# Patient Record
Sex: Male | Born: 1950 | Hispanic: Yes | Marital: Married | Smoking: Former smoker
Health system: Southern US, Community
[De-identification: ages and names within clinical notes are randomized; demographics above are authoritative.]

## PROBLEM LIST (undated history)

## (undated) DIAGNOSIS — E119 Type 2 diabetes mellitus without complications: Secondary | ICD-10-CM

## (undated) DIAGNOSIS — I609 Nontraumatic subarachnoid hemorrhage, unspecified: Secondary | ICD-10-CM

## (undated) DIAGNOSIS — R1310 Dysphagia, unspecified: Secondary | ICD-10-CM

## (undated) DIAGNOSIS — I10 Essential (primary) hypertension: Secondary | ICD-10-CM

## (undated) DIAGNOSIS — R569 Unspecified convulsions: Secondary | ICD-10-CM

## (undated) HISTORY — PX: NO PAST SURGERIES: SHX2092

---

## 2013-06-14 DIAGNOSIS — I609 Nontraumatic subarachnoid hemorrhage, unspecified: Secondary | ICD-10-CM

## 2013-06-14 DIAGNOSIS — R1310 Dysphagia, unspecified: Secondary | ICD-10-CM

## 2013-06-14 HISTORY — DX: Dysphagia, unspecified: R13.10

## 2013-06-14 HISTORY — DX: Nontraumatic subarachnoid hemorrhage, unspecified: I60.9

## 2015-02-22 ENCOUNTER — Emergency Department (HOSPITAL_COMMUNITY): Payer: 59

## 2015-02-22 ENCOUNTER — Inpatient Hospital Stay (HOSPITAL_COMMUNITY)
Admission: EM | Admit: 2015-02-22 | Discharge: 2015-03-17 | DRG: 064 | Disposition: E | Payer: 59 | Attending: Neurology | Admitting: Neurology

## 2015-02-22 ENCOUNTER — Encounter (HOSPITAL_COMMUNITY): Admission: EM | Disposition: E | Payer: Self-pay | Source: Home / Self Care | Attending: Neurology

## 2015-02-22 ENCOUNTER — Inpatient Hospital Stay (HOSPITAL_COMMUNITY): Payer: 59

## 2015-02-22 ENCOUNTER — Encounter (HOSPITAL_COMMUNITY): Payer: Self-pay | Admitting: Anesthesiology

## 2015-02-22 ENCOUNTER — Encounter (HOSPITAL_COMMUNITY): Payer: Self-pay | Admitting: Neurology

## 2015-02-22 DIAGNOSIS — E876 Hypokalemia: Secondary | ICD-10-CM | POA: Diagnosis not present

## 2015-02-22 DIAGNOSIS — T17908A Unspecified foreign body in respiratory tract, part unspecified causing other injury, initial encounter: Secondary | ICD-10-CM

## 2015-02-22 DIAGNOSIS — J9601 Acute respiratory failure with hypoxia: Secondary | ICD-10-CM | POA: Diagnosis not present

## 2015-02-22 DIAGNOSIS — N179 Acute kidney failure, unspecified: Secondary | ICD-10-CM | POA: Diagnosis not present

## 2015-02-22 DIAGNOSIS — R509 Fever, unspecified: Secondary | ICD-10-CM

## 2015-02-22 DIAGNOSIS — Z87891 Personal history of nicotine dependence: Secondary | ICD-10-CM

## 2015-02-22 DIAGNOSIS — Z4659 Encounter for fitting and adjustment of other gastrointestinal appliance and device: Secondary | ICD-10-CM

## 2015-02-22 DIAGNOSIS — D638 Anemia in other chronic diseases classified elsewhere: Secondary | ICD-10-CM | POA: Diagnosis present

## 2015-02-22 DIAGNOSIS — Z66 Do not resuscitate: Secondary | ICD-10-CM | POA: Diagnosis present

## 2015-02-22 DIAGNOSIS — E1165 Type 2 diabetes mellitus with hyperglycemia: Secondary | ICD-10-CM | POA: Diagnosis present

## 2015-02-22 DIAGNOSIS — I2510 Atherosclerotic heart disease of native coronary artery without angina pectoris: Secondary | ICD-10-CM | POA: Diagnosis present

## 2015-02-22 DIAGNOSIS — I619 Nontraumatic intracerebral hemorrhage, unspecified: Secondary | ICD-10-CM

## 2015-02-22 DIAGNOSIS — Z87898 Personal history of other specified conditions: Secondary | ICD-10-CM | POA: Diagnosis not present

## 2015-02-22 DIAGNOSIS — I610 Nontraumatic intracerebral hemorrhage in hemisphere, subcortical: Secondary | ICD-10-CM | POA: Diagnosis not present

## 2015-02-22 DIAGNOSIS — G8191 Hemiplegia, unspecified affecting right dominant side: Secondary | ICD-10-CM | POA: Diagnosis present

## 2015-02-22 DIAGNOSIS — J690 Pneumonitis due to inhalation of food and vomit: Secondary | ICD-10-CM | POA: Diagnosis not present

## 2015-02-22 DIAGNOSIS — R0600 Dyspnea, unspecified: Secondary | ICD-10-CM

## 2015-02-22 DIAGNOSIS — G40909 Epilepsy, unspecified, not intractable, without status epilepticus: Secondary | ICD-10-CM | POA: Diagnosis present

## 2015-02-22 DIAGNOSIS — R1310 Dysphagia, unspecified: Secondary | ICD-10-CM | POA: Insufficient documentation

## 2015-02-22 DIAGNOSIS — J9600 Acute respiratory failure, unspecified whether with hypoxia or hypercapnia: Secondary | ICD-10-CM

## 2015-02-22 DIAGNOSIS — R4701 Aphasia: Secondary | ICD-10-CM | POA: Diagnosis present

## 2015-02-22 DIAGNOSIS — E870 Hyperosmolality and hypernatremia: Secondary | ICD-10-CM | POA: Diagnosis not present

## 2015-02-22 DIAGNOSIS — I609 Nontraumatic subarachnoid hemorrhage, unspecified: Secondary | ICD-10-CM | POA: Diagnosis present

## 2015-02-22 DIAGNOSIS — I615 Nontraumatic intracerebral hemorrhage, intraventricular: Principal | ICD-10-CM | POA: Diagnosis present

## 2015-02-22 DIAGNOSIS — A419 Sepsis, unspecified organism: Secondary | ICD-10-CM | POA: Diagnosis not present

## 2015-02-22 DIAGNOSIS — R000 Tachycardia, unspecified: Secondary | ICD-10-CM | POA: Diagnosis not present

## 2015-02-22 DIAGNOSIS — R2990 Unspecified symptoms and signs involving the nervous system: Secondary | ICD-10-CM

## 2015-02-22 DIAGNOSIS — E440 Moderate protein-calorie malnutrition: Secondary | ICD-10-CM | POA: Diagnosis present

## 2015-02-22 DIAGNOSIS — G936 Cerebral edema: Secondary | ICD-10-CM | POA: Diagnosis present

## 2015-02-22 DIAGNOSIS — E872 Acidosis: Secondary | ICD-10-CM | POA: Diagnosis not present

## 2015-02-22 DIAGNOSIS — I639 Cerebral infarction, unspecified: Secondary | ICD-10-CM

## 2015-02-22 DIAGNOSIS — T17998D Other foreign object in respiratory tract, part unspecified causing other injury, subsequent encounter: Secondary | ICD-10-CM | POA: Diagnosis not present

## 2015-02-22 DIAGNOSIS — I611 Nontraumatic intracerebral hemorrhage in hemisphere, cortical: Secondary | ICD-10-CM | POA: Diagnosis not present

## 2015-02-22 DIAGNOSIS — Z8673 Personal history of transient ischemic attack (TIA), and cerebral infarction without residual deficits: Secondary | ICD-10-CM

## 2015-02-22 DIAGNOSIS — E785 Hyperlipidemia, unspecified: Secondary | ICD-10-CM | POA: Diagnosis present

## 2015-02-22 DIAGNOSIS — R4182 Altered mental status, unspecified: Secondary | ICD-10-CM

## 2015-02-22 DIAGNOSIS — I10 Essential (primary) hypertension: Secondary | ICD-10-CM | POA: Diagnosis present

## 2015-02-22 DIAGNOSIS — I6789 Other cerebrovascular disease: Secondary | ICD-10-CM | POA: Diagnosis not present

## 2015-02-22 HISTORY — DX: Unspecified convulsions: R56.9

## 2015-02-22 HISTORY — DX: Nontraumatic subarachnoid hemorrhage, unspecified: I60.9

## 2015-02-22 HISTORY — DX: Type 2 diabetes mellitus without complications: E11.9

## 2015-02-22 HISTORY — DX: Essential (primary) hypertension: I10

## 2015-02-22 HISTORY — DX: Dysphagia, unspecified: R13.10

## 2015-02-22 LAB — PROTIME-INR
INR: 1.35 (ref 0.00–1.49)
PROTHROMBIN TIME: 16.8 s — AB (ref 11.6–15.2)

## 2015-02-22 LAB — DIFFERENTIAL
Basophils Absolute: 0.1 10*3/uL (ref 0.0–0.1)
Basophils Relative: 1 %
EOS PCT: 1 %
Eosinophils Absolute: 0.1 10*3/uL (ref 0.0–0.7)
LYMPHS ABS: 2.6 10*3/uL (ref 0.7–4.0)
LYMPHS PCT: 33 %
MONO ABS: 0.9 10*3/uL (ref 0.1–1.0)
MONOS PCT: 11 %
NEUTROS ABS: 4.5 10*3/uL (ref 1.7–7.7)
Neutrophils Relative %: 54 %

## 2015-02-22 LAB — I-STAT TROPONIN, ED: Troponin i, poc: 0 ng/mL (ref 0.00–0.08)

## 2015-02-22 LAB — COMPREHENSIVE METABOLIC PANEL
ALK PHOS: 47 U/L (ref 38–126)
ALT: 14 U/L — AB (ref 17–63)
ANION GAP: 7 (ref 5–15)
AST: 24 U/L (ref 15–41)
Albumin: 3.9 g/dL (ref 3.5–5.0)
BILIRUBIN TOTAL: 1 mg/dL (ref 0.3–1.2)
BUN: 15 mg/dL (ref 6–20)
CALCIUM: 9.4 mg/dL (ref 8.9–10.3)
CO2: 26 mmol/L (ref 22–32)
CREATININE: 0.98 mg/dL (ref 0.61–1.24)
Chloride: 106 mmol/L (ref 101–111)
GFR calc non Af Amer: >60 mL/min (ref >60)
Glucose, Bld: 163 mg/dL — ABNORMAL HIGH (ref 65–99)
Potassium: 4.7 mmol/L (ref 3.5–5.1)
Sodium: 139 mmol/L (ref 135–145)
TOTAL PROTEIN: 6.7 g/dL (ref 6.5–8.1)

## 2015-02-22 LAB — CBC
HEMATOCRIT: 30.2 % — AB (ref 39.0–52.0)
HEMOGLOBIN: 9.7 g/dL — AB (ref 13.0–17.0)
MCH: 29 pg (ref 26.0–34.0)
MCHC: 32.1 g/dL (ref 30.0–36.0)
MCV: 90.1 fL (ref 78.0–100.0)
Platelets: 221 10*3/uL (ref 150–400)
RBC: 3.35 MIL/uL — AB (ref 4.22–5.81)
RDW: 12.9 % (ref 11.5–15.5)
WBC: 8.1 10*3/uL (ref 4.0–10.5)

## 2015-02-22 LAB — I-STAT CHEM 8, ED
BUN: 17 mg/dL (ref 6–20)
CALCIUM ION: 1.24 mmol/L (ref 1.13–1.30)
CHLORIDE: 103 mmol/L (ref 101–111)
Creatinine, Ser: 0.9 mg/dL (ref 0.61–1.24)
GLUCOSE: 155 mg/dL — AB (ref 65–99)
HCT: 31 % — ABNORMAL LOW (ref 39.0–52.0)
Hemoglobin: 10.5 g/dL — ABNORMAL LOW (ref 13.0–17.0)
Potassium: 4.7 mmol/L (ref 3.5–5.1)
Sodium: 142 mmol/L (ref 135–145)
TCO2: 25 mmol/L (ref 0–100)

## 2015-02-22 LAB — MRSA PCR SCREENING: MRSA BY PCR: NEGATIVE

## 2015-02-22 LAB — GLUCOSE, CAPILLARY: GLUCOSE-CAPILLARY: 171 mg/dL — AB (ref 65–99)

## 2015-02-22 LAB — SODIUM: SODIUM: 139 mmol/L (ref 135–145)

## 2015-02-22 LAB — APTT: aPTT: 34 seconds (ref 24–37)

## 2015-02-22 LAB — CBG MONITORING, ED: GLUCOSE-CAPILLARY: 128 mg/dL — AB (ref 65–99)

## 2015-02-22 SURGERY — RADIOLOGY WITH ANESTHESIA
Anesthesia: Monitor Anesthesia Care

## 2015-02-22 MED ORDER — ACETAMINOPHEN 325 MG PO TABS
650.0000 mg | ORAL_TABLET | ORAL | Status: DC | PRN
  Administered 2015-02-23 – 2015-02-25 (×4): 650 mg via ORAL
  Filled 2015-02-22 (×4): qty 2

## 2015-02-22 MED ORDER — SODIUM CHLORIDE 0.9 % IV SOLN: INTRAVENOUS | Status: DC

## 2015-02-22 MED ORDER — STROKE: EARLY STAGES OF RECOVERY BOOK
Freq: Once | Status: DC
  Filled 2015-02-22: qty 1

## 2015-02-22 MED ORDER — ACETAMINOPHEN 650 MG RE SUPP
650.0000 mg | RECTAL | Status: DC | PRN
  Administered 2015-02-22 – 2015-02-23 (×3): 650 mg via RECTAL
  Filled 2015-02-22 (×3): qty 1

## 2015-02-22 MED ORDER — LABETALOL HCL 5 MG/ML IV SOLN
INTRAVENOUS | Status: AC
  Filled 2015-02-22: qty 4

## 2015-02-22 MED ORDER — SODIUM CHLORIDE 3 % IV SOLN
INTRAVENOUS | Status: DC
  Administered 2015-02-22: 50 mL/h via INTRAVENOUS
  Administered 2015-02-23 (×3): 75 mL/h via INTRAVENOUS
  Filled 2015-02-22 (×18): qty 500

## 2015-02-22 MED ORDER — LABETALOL HCL 5 MG/ML IV SOLN
10.0000 mg | INTRAVENOUS | Status: DC | PRN
  Administered 2015-02-23: 20 mg via INTRAVENOUS
  Administered 2015-02-25: 15 mg via INTRAVENOUS
  Administered 2015-02-25 (×2): 20 mg via INTRAVENOUS
  Filled 2015-02-22 (×4): qty 4

## 2015-02-22 MED ORDER — LABETALOL HCL 5 MG/ML IV SOLN
10.0000 mg | Freq: Once | INTRAVENOUS | Status: AC
Start: 2015-02-22 — End: 2015-02-22
  Administered 2015-02-22: 10 mg via INTRAVENOUS

## 2015-02-22 MED ORDER — PNEUMOCOCCAL VAC POLYVALENT 25 MCG/0.5ML IJ INJ: 0.5000 mL | INJECTION | INTRAMUSCULAR | Status: DC

## 2015-02-22 MED ORDER — PANTOPRAZOLE SODIUM 40 MG IV SOLR
40.0000 mg | Freq: Every day | INTRAVENOUS | Status: DC
  Administered 2015-02-22 – 2015-02-25 (×4): 40 mg via INTRAVENOUS
  Filled 2015-02-22 (×5): qty 40

## 2015-02-22 MED ORDER — SENNOSIDES-DOCUSATE SODIUM 8.6-50 MG PO TABS
1.0000 | ORAL_TABLET | Freq: Two times a day (BID) | ORAL | Status: DC
  Administered 2015-02-23 – 2015-02-25 (×5): 1 via ORAL
  Filled 2015-02-22 (×5): qty 1

## 2015-02-22 MED ORDER — NICARDIPINE HCL IN NACL 20-0.86 MG/200ML-% IV SOLN
3.0000 mg/h | INTRAVENOUS | Status: DC
  Administered 2015-02-22: 5 mg/h via INTRAVENOUS
  Administered 2015-02-23: 4 mg/h via INTRAVENOUS
  Administered 2015-02-23: 5 mg/h via INTRAVENOUS
  Administered 2015-02-23: 3.5 mg/h via INTRAVENOUS
  Administered 2015-02-24: 1.5 mg/h via INTRAVENOUS
  Administered 2015-02-24: 5 mg/h via INTRAVENOUS
  Administered 2015-02-25: 2 mg/h via INTRAVENOUS
  Administered 2015-02-25 – 2015-02-26 (×2): 5 mg/h via INTRAVENOUS
  Filled 2015-02-22 (×13): qty 200

## 2015-02-22 NOTE — ED Notes (Signed)
Pt CBG, 128. Nurse was notified.

## 2015-02-22 NOTE — Consult Note (Signed)
CC:  Chief Complaint  Patient presents with  . Code Stroke    HPI: Keith Bradshaw is a 64 y.o. right handed male who awoke from a nap aphasic, hemiplegic, and with a left gaze preference. He was brought to the ER because stroke was called and he was found to have a left insular hemorrhage.  PMH: Past Medical History  Diagnosis Date  . Hypertension   . Diabetes (HCC)     PSH: No past surgical history on file.  SH: Social History  Substance Use Topics  . Smoking status: Not on file  . Smokeless tobacco: Not on file  . Alcohol Use: Not on file    MEDS: Prior to Admission medications   Medication Sig Start Date End Date Taking? Authorizing Provider  carvedilol (COREG) 6.25 MG tablet Take 6.25 mg by mouth 2 (two) times daily. 02/20/15  Yes Historical Provider, MD  citalopram (CELEXA) 20 MG tablet Take 20 mg by mouth daily. 02/20/15  Yes Historical Provider, MD  gabapentin (NEURONTIN) 100 MG capsule  01/28/15  Yes Historical Provider, MD  levETIRAcetam (KEPPRA) 250 MG tablet Take 250 mg by mouth 2 (two) times daily.  12/02/14  Yes Historical Provider, MD  lisinopril (PRINIVIL,ZESTRIL) 10 MG tablet Take 10 mg by mouth daily.   Yes Historical Provider, MD  lovastatin (MEVACOR) 20 MG tablet Take 20 mg by mouth daily. 02/20/15  Yes Historical Provider, MD  metFORMIN (GLUCOPHAGE) 1000 MG tablet Take 1,000 mg by mouth 2 (two) times daily. 02/20/15  Yes Historical Provider, MD    ALLERGY: Not on File  ROS: ROS  NEUROLOGIC EXAM: Awake and alert, and aphasic Left gaze preference Dense dense right hemiparesis, withdraws on left  IMGAING: CT head: Large left insular intracerebral hemorrhage with mass effect and midline shift  IMPRESSION: - 64 y.o. male with dominant hemisphere insular hemorrhage. Given the location of this hemorrhage there is no role for surgery.  PLAN: - Maximum medical therapy per neurology team

## 2015-02-22 NOTE — Code Documentation (Signed)
64[facility] [id number].  EMS reports that the patient took a nap at 1000 and patient's family tried to wake him up at 1330 and found him to be altered.  EMS called and assessed right hemiplegia and left gaze and activated a Code Stroke.  Stroke team at the bedside on patient arrival.  Labs drawn and patient cleared for CT by Dr. Fredderick PhenixBelfi.  Patient to CT.  CT showing large left ICH.  NIHSS 26, see documentation for details and code stroke times.  Patient with continued right hemiplegia, left gaze and nonverbal.  Patient's wife at the bedside.  Interpreter phone utilized d/t patient and family speaking [place].  Patient hypertensive and Labetalol 10mg  IVP ordered by Dr. Leroy Kennedyamilo and given by ED RN.  Cardene gtt ordered to keep SBP less than 160 per Dr. Leroy Kennedyamilo.   Patient to be admitted to ICU. Bedside handoff with ED RN Nehemiah SettleBrooke.

## 2015-02-22 NOTE — ED Notes (Signed)
Dr. Thad Rangereynolds, MD at the bedside speaking to the family.

## 2015-02-22 NOTE — H&P (Addendum)
Admission H&P    Chief Complaint:  code stroke, right hemiplegia, aphasia, let gaze preference. HPI: Keith Bradshaw is an 64 y.o. male with a past medical history significant for HTN, DM, ischemic stroke without residual deficits, brought in by EMS due to acute onset of the above stated symptoms. Wife is at the bedside, speaks [place] but does not speak [place], thus all information was obtained via an interpreter. Patient was home, took a nap around 10 am and around 1 pm wife couldn't wake him up. EMS was called and he was noted to have right sided weakness, aphasia, gaze preference to the left, and documented BP 180/120.Marland Kitchen  Unenhanced CT head revealed a large left frontoparietal intracerebral hemorrhage measuring up to 6 cm. 4 mm of left-to-right midline shift. No hydrocephalus or IV extension. Reviewed labs: PTT 34, INR 1.35, Platelets 221. No report of recent head trauma, use of anticoagulants, or a antiplatelets.   Currently he is aphasic with dense right hemiplegia and left gaze preference. He remained with SBP 190/110 in the ED: administered 10 mg IV labetalol and started IV nicardipine.  LSN: 10 am on 02/26/2015 tPA Given: no, ICH ICH score: 3   Past Medical History  Diagnosis Date  . Hypertension   . Diabetes (HCC)     No past surgical history on file.  No family history on file. Social History:  has no tobacco, alcohol, and drug history on file.  Allergies: Allergies not on file   (Not in a hospital admission)  ROS: unable to obtain due to mental status   Physical Examination: Blood pressure 186/105, pulse 79, resp. rate 17, SpO2 100 %.  Physical exam:  Constitutional: well developed, pleasant male in no apparent distress. Eyes: no jaundice or exophthalmos.  Head: normocephalic. Neck: supple, no bruits, no JVD. Cardiac: no murmurs. Lungs: clear. Abdomen: soft, no tender, no mass. Extremities: no edema, clubbing, or cyanosis.  Skin: no rash  Neuroexam Mental  Status: Open eyes but globally aphasic. Cranial Nerves: II: Discs flat bilaterally; Doesn't blink to threat, , pupils equal, round, reactive to light and accommodation III,IV, VI: ptosis not present, left gaze preference V,VII: smile symmetric, facial light touch sensation normal bilaterally VIII: hearing can not be reliably tested IX,X: uvula rises symmetrically XI: bilateral shoulder shrug no tested XII: midline tongue extension without atrophy or fasciculations Motor: Dense right hemiplegia Tone decreased on the right Sensory: reacts to pain Deep Tendon Reflexes:  Right: Upper Extremity   Left: Upper extremity   biceps (C-5 to C-6) 2/4   biceps (C-5 to C-6) 2/4 tricep (C7) 2/4    triceps (C7) 2/4 Brachioradialis (C6) 2/4  Brachioradialis (C6) 2/4  Lower Extremity Lower Extremity  quadriceps (L-2 to L-4) 2/4   quadriceps (L-2 to L-4) 2/4 Achilles (S1) 2/4   Achilles (S1) 2/4  Plantars: Right: upgoing   Left: downgoing Cerebellar: No tested due to mental status Gait:  No tested due to mental status    Results for orders placed or performed during the hospital encounter of 02/26/2015 (from the past 48 hour(s))  Protime-INR     Status: Abnormal   Collection Time: 03/07/2015  2:37 PM  Result Value Ref Range   Prothrombin Time 16.8 (H) 11.6 - 15.2 seconds   INR 1.35 0.00 - 1.49  APTT     Status: None   Collection Time: 02/17/2015  2:37 PM  Result Value Ref Range   aPTT 34 24 - 37 seconds  CBC     Status:  Abnormal   Collection Time: Jun 14, 2014  2:37 PM  Result Value Ref Range   WBC 8.1 4.0 - 10.5 K/uL   RBC 3.35 (L) 4.22 - 5.81 MIL/uL   Hemoglobin 9.7 (L) 13.0 - 17.0 g/dL   HCT 16.130.2 (L) 09.639.0 - 04.552.0 %   MCV 90.1 78.0 - 100.0 fL   MCH 29.0 26.0 - 34.0 pg   MCHC 32.1 30.0 - 36.0 g/dL   RDW 40.912.9 81.111.5 - 91.415.5 %   Platelets 221 150 - 400 K/uL  Differential     Status: None   Collection Time: Jun 14, 2014  2:37 PM  Result Value Ref Range   Neutrophils Relative % 54 %   Neutro Abs  4.5 1.7 - 7.7 K/uL   Lymphocytes Relative 33 %   Lymphs Abs 2.6 0.7 - 4.0 K/uL   Monocytes Relative 11 %   Monocytes Absolute 0.9 0.1 - 1.0 K/uL   Eosinophils Relative 1 %   Eosinophils Absolute 0.1 0.0 - 0.7 K/uL   Basophils Relative 1 %   Basophils Absolute 0.1 0.0 - 0.1 K/uL  I-stat troponin, ED ([facility])     Status: None   Collection Time: Jun 14, 2014  2:39 PM  Result Value Ref Range   Troponin i, poc 0.00 0.00 - 0.08 ng/mL   Comment 3            Comment: Due to the release kinetics of cTnI, a negative result within the first hours of the onset of symptoms does not rule out myocardial infarction with certainty. If myocardial infarction is still suspected, repeat the test at appropriate intervals.   I-Stat Chem 8, ED  ([facility])     Status: Abnormal   Collection Time: Jun 14, 2014  2:41 PM  Result Value Ref Range   Sodium 142 135 - 145 mmol/L   Potassium 4.7 3.5 - 5.1 mmol/L   Chloride 103 101 - 111 mmol/L   BUN 17 6 - 20 mg/dL   Creatinine, Ser 7.820.90 0.61 - 1.24 mg/dL   Glucose, Bld 956155 (H) 65 - 99 mg/dL   Calcium, Ion 2.131.24 0.861.13 - 1.30 mmol/L   TCO2 25 0 - 100 mmol/L   Hemoglobin 10.5 (L) 13.0 - 17.0 g/dL   HCT 57.831.0 (L) 46.939.0 - 62.952.0 %   Ct Head Wo Contrast  03/01/2015  CLINICAL DATA:  Code stroke. Woke up after nap today with left side gaze and flaccid right arm. Nonverbal. EXAM: CT HEAD WITHOUT CONTRAST TECHNIQUE: Contiguous axial images were obtained from the base of the skull through the vertex without intravenous contrast. COMPARISON:  None. FINDINGS: There is a large left cerebral hemisphere bleed in the frontal parietal lobe. This measures 6.0 x 4.3 cm. Mass-effect with 4 mm of left-to-right midline shift. Probable small amount of adjacent subarachnoid No hydrocephalus. Chronic microvascular changes in the deep white matter. Mild cerebral atrophy. Visualized paranasal sinuses and mastoids clear. Orbital soft tissues unremarkable. IMPRESSION: Large left  frontoparietal intra cerebral hemorrhage measuring up to 6 cm. 4 mm of left-to-right midline shift. Critical Value/emergent results were called by telephone at the time of interpretation on 02/23/2015 at 2:50 pm to Dr. Cyril Mourningamillo , who verbally acknowledged these results. Electronically Signed   By: Charlett NoseKevin  Dover M.D.   On: 01-18-15 14:53    Assessment: 64 y.o. male brought in as a code stroke due to acute onset right hemiplegia, aphasia, and left gaze preference. CT brain disclosed a large left frontoparietal intracerebral hemorrhage measuring up  to 6 cm. 4 mm of left-to-right midline shift. No hydrocephalus or IV extension. 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 Seems to be protecting his airway at this moment. Current ICH score 3. 1) Admit to NICU. 2) IV nicardipine, goal SBP <160 3) 3% hypertonic saline 4) No antiplatelets or anticoagulants. 5) Consulted critical care medicine for central line placement.  6) Neurosurgery called by ED attending. 7) Follow up CT brain without contrast tomorrow. Stroke team will follow up tomorrow.  Stroke Risk Factors - age,  HTN, DM, prior stroke   Wyatt Portela, MD [facility] [phone]  02/18/2015, 3:10 PM

## 2015-02-22 NOTE — Procedures (Signed)
Central Venous Catheter Insertion Procedure Note Ashok Croonetra Faria [id number] 01-Mar-1951  Procedure: Insertion of Central Venous Catheter Indications: Assessment of intravascular volume, Drug and/or fluid administration and Frequent blood sampling  Procedure Details Consent: Risks of procedure as well as the alternatives and risks of each were explained to the (patient/caregiver).  Consent for procedure obtained. Time Out: Verified patient identification, verified procedure, site/side was marked, verified correct patient position, special equipment/implants available, medications/allergies/relevent history reviewed, required imaging and test results available.  Performed  Maximum sterile technique was used including antiseptics, cap, gloves, gown, hand hygiene, mask and sheet. Skin prep: Chlorhexidine; local anesthetic administered A antimicrobial bonded/coated triple lumen catheter was placed in the right internal jugular vein using the Seldinger technique. Ultrasound guidance used.Yes.   Catheter placed to 14 cm. Blood aspirated via all 3 ports and then flushed x 3. Line sutured x 2 and dressing applied.  Evaluation Blood flow good Complications: No apparent complications Patient did tolerate procedure well. Chest X-ray ordered to verify placement.  CXR: pending.  Joneen RoachPaul Hoffman, AGACNP-BC [facility] Pager [phone] or [phone]  03/16/2015 4:44 PM  U/S used in placement.  I was present and supervised the entire procedure.  Alyson ReedyWesam G. Dyrell Tuccillo, M.D. [facility]/Critical [facility] Medicine. Pager: [phone]. After hours pager: [phone].

## 2015-02-22 NOTE — ED Provider Notes (Signed)
CSN: [id number]     Arrival date & time 03/05/2015  1436 History   First MD Initiated Contact with Patient 2014-11-26 1438     Chief Complaint  Patient presents with  . Code Stroke    @EDPCLEARED @ (Consider location/radiation/quality/duration/timing/severity/associated sxs/prior Treatment) HPI Comments: PT with hx of HTN and DM presents as Code stroke.  Per his wife (through [place] interpretor) pt took a walk this am and came back home and went to take a nap around 10:30.  This afternoon, family was not able to wake up pt.  His right side was noted to be flaccid and he has been non-verbal.  Was brought in by EMS as code stroke.  Hx limited due to pt condition.   Past Medical History  Diagnosis Date  . Hypertension   . Diabetes (HCC)    No past surgical history on file. No family history on file. Social History  Substance Use Topics  . Smoking status: Not on file  . Smokeless tobacco: Not on file  . Alcohol Use: Not on file    Review of Systems  Unable to perform ROS: Patient nonverbal      Allergies  Review of patient's allergies indicates not on file.  Home Medications   Prior to Admission medications   Medication Sig Start Date End Date Taking? Authorizing Provider  carvedilol (COREG) 6.25 MG tablet Take 6.25 mg by mouth 2 (two) times daily. 02/20/15  Yes Historical Provider, MD  citalopram (CELEXA) 20 MG tablet Take 20 mg by mouth daily. 02/20/15  Yes Historical Provider, MD  gabapentin (NEURONTIN) 100 MG capsule  01/28/15  Yes Historical Provider, MD  levETIRAcetam (KEPPRA) 250 MG tablet Take 250 mg by mouth 2 (two) times daily.  12/02/14  Yes Historical Provider, MD  lisinopril (PRINIVIL,ZESTRIL) 10 MG tablet Take 10 mg by mouth daily.   Yes Historical Provider, MD  lovastatin (MEVACOR) 20 MG tablet Take 20 mg by mouth daily. 02/20/15  Yes Historical Provider, MD  metFORMIN (GLUCOPHAGE) 1000 MG tablet Take 1,000 mg by mouth 2 (two) times daily. 02/20/15  Yes Historical  Provider, MD   BP 186/105 mmHg  Pulse 79  Resp 17  SpO2 100% Physical Exam  Constitutional: He appears well-developed and well-nourished.  HENT:  Head: Normocephalic and atraumatic.  Eyes: Pupils are equal, round, and reactive to light.  Neck: Normal range of motion. Neck supple.  Cardiovascular: Normal rate, regular rhythm and normal heart sounds.   Pulmonary/Chest: Effort normal and breath sounds normal. No respiratory distress. He has no wheezes. He has no rales. He exhibits no tenderness.  Abdominal: Soft. Bowel sounds are normal. There is no tenderness. There is no rebound and no guarding.  Musculoskeletal: Normal range of motion. He exhibits no edema.  Lymphadenopathy:    He has no cervical adenopathy.  Neurological:  PT will open eyes and has head lifted up, but is non-verbqal, has apparent right side neglect.  Flaccid right arm and leg.  Will not follow commands  Skin: Skin is warm and dry. No rash noted.  Psychiatric: He has a normal mood and affect.    ED Course  Procedures (including critical care time) Labs Review Labs Reviewed  PROTIME-INR - Abnormal; Notable for the following:    Prothrombin Time 16.8 (*)    All other components within normal limits  CBC - Abnormal; Notable for the following:    RBC 3.35 (*)    Hemoglobin 9.7 (*)    HCT 30.2 (*)  All other components within normal limits  COMPREHENSIVE METABOLIC PANEL - Abnormal; Notable for the following:    Glucose, Bld 163 (*)    ALT 14 (*)    All other components within normal limits  CBG MONITORING, ED - Abnormal; Notable for the following:    Glucose-Capillary 128 (*)    All other components within normal limits  I-STAT CHEM 8, ED - Abnormal; Notable for the following:    Glucose, Bld 155 (*)    Hemoglobin 10.5 (*)    HCT 31.0 (*)    All other components within normal limits  APTT  DIFFERENTIAL  SODIUM  SODIUM  SODIUM  I-STAT TROPOININ, ED    Imaging Review Ct Head Wo  Contrast  02/25/2015  CLINICAL DATA:  Code stroke. Woke up after nap today with left side gaze and flaccid right arm. Nonverbal. EXAM: CT HEAD WITHOUT CONTRAST TECHNIQUE: Contiguous axial images were obtained from the base of the skull through the vertex without intravenous contrast. COMPARISON:  None. FINDINGS: There is a large left cerebral hemisphere bleed in the frontal parietal lobe. This measures 6.0 x 4.3 cm. Mass-effect with 4 mm of left-to-right midline shift. Probable small amount of adjacent subarachnoid No hydrocephalus. Chronic microvascular changes in the deep white matter. Mild cerebral atrophy. Visualized paranasal sinuses and mastoids clear. Orbital soft tissues unremarkable. IMPRESSION: Large left frontoparietal intra cerebral hemorrhage measuring up to 6 cm. 4 mm of left-to-right midline shift. Critical Value/emergent results were called by telephone at the time of interpretation on 03/05/2015 at 2:50 pm to Dr. Cyril Mourning , who verbally acknowledged these results. Electronically Signed   By: Charlett Nose M.D.   On: 03/10/2015 14:53   I have personally reviewed and evaluated these images and lab results as part of my medical decision-making.   EKG Interpretation   Date/Time:  Tuesday February 22 2015 15:13:15 EST Ventricular Rate:  75 PR Interval:  176 QRS Duration: 126 QT Interval:  475 QTC Calculation: 531 R Axis:   54 Text Interpretation:  Sinus rhythm Nonspecific intraventricular conduction  delay ST elevation, consider inferior injury Artifact in lead(s) I II III  aVR aVL aVF V1 V2 V3 V4 V5 V6 No old tracing to compare Confirmed by Satara Virella   MD, Vanassa Penniman ([id number]) on 02/26/2015 4:18:27 PM      MDM   Final diagnoses:  Acute embolic stroke (HCC)  CVA (cerebral infarction)    Patient has a large area of intracerebral hemorrhage. He does appear to be protecting his airway at this point. Dr. Leroy Kennedy with neurology has seen the patient and will admit the patient to the neurology  service. I also consulted neurosurgery, Dr. Bevely Palmer,  who will see the patient although he does not feel that the patient is a surgical candidate.  Pt to be admitted to ICU  CRITICAL CARE Performed by: Margarett Viti Total critical care time: 40 minutes Critical care time was exclusive of separately billable procedures and treating other patients. Critical care was necessary to treat or prevent imminent or life-threatening deterioration. Critical care was time spent personally by me on the following activities: development of treatment plan with patient and/or surrogate as well as nursing, discussions with consultants, evaluation of patient's response to treatment, examination of patient, obtaining history from patient or surrogate, ordering and performing treatments and interventions, ordering and review of laboratory studies, ordering and review of radiographic studies, pulse oximetry and re-evaluation of patient's condition.     Rolan Bucco, MD 03/06/2015 [phone]

## 2015-02-23 ENCOUNTER — Inpatient Hospital Stay (HOSPITAL_COMMUNITY): Payer: 59

## 2015-02-23 DIAGNOSIS — I615 Nontraumatic intracerebral hemorrhage, intraventricular: Principal | ICD-10-CM

## 2015-02-23 DIAGNOSIS — I6789 Other cerebrovascular disease: Secondary | ICD-10-CM

## 2015-02-23 DIAGNOSIS — G936 Cerebral edema: Secondary | ICD-10-CM | POA: Insufficient documentation

## 2015-02-23 DIAGNOSIS — I10 Essential (primary) hypertension: Secondary | ICD-10-CM | POA: Insufficient documentation

## 2015-02-23 LAB — GLUCOSE, CAPILLARY
GLUCOSE-CAPILLARY: 176 mg/dL — AB (ref 65–99)
GLUCOSE-CAPILLARY: 177 mg/dL — AB (ref 65–99)
GLUCOSE-CAPILLARY: 186 mg/dL — AB (ref 65–99)
GLUCOSE-CAPILLARY: 192 mg/dL — AB (ref 65–99)
Glucose-Capillary: 213 mg/dL — ABNORMAL HIGH (ref 65–99)
Glucose-Capillary: 120 mg/dL — ABNORMAL HIGH (ref 65–99)

## 2015-02-23 LAB — SODIUM
SODIUM: 150 mmol/L — AB (ref 135–145)
Sodium: 141 mmol/L (ref 135–145)
Sodium: 156 mmol/L — ABNORMAL HIGH (ref 135–145)

## 2015-02-23 LAB — CBC
HCT: 26.9 % — ABNORMAL LOW (ref 39.0–52.0)
Hemoglobin: 8.9 g/dL — ABNORMAL LOW (ref 13.0–17.0)
MCH: 29.5 pg (ref 26.0–34.0)
MCHC: 33.1 g/dL (ref 30.0–36.0)
MCV: 89.1 fL (ref 78.0–100.0)
PLATELETS: 226 10*3/uL (ref 150–400)
RBC: 3.02 MIL/uL — AB (ref 4.22–5.81)
RDW: 13 % (ref 11.5–15.5)
WBC: 12.3 10*3/uL — ABNORMAL HIGH (ref 4.0–10.5)

## 2015-02-23 LAB — BASIC METABOLIC PANEL
Anion gap: 7 (ref 5–15)
BUN: 14 mg/dL (ref 6–20)
CALCIUM: 8.6 mg/dL — AB (ref 8.9–10.3)
CO2: 23 mmol/L (ref 22–32)
CREATININE: 0.99 mg/dL (ref 0.61–1.24)
Chloride: 115 mmol/L — ABNORMAL HIGH (ref 101–111)
GFR calc Af Amer: >60 mL/min (ref >60)
Glucose, Bld: 195 mg/dL — ABNORMAL HIGH (ref 65–99)
Potassium: 3.9 mmol/L (ref 3.5–5.1)
SODIUM: 145 mmol/L (ref 135–145)

## 2015-02-23 MED ORDER — METOPROLOL TARTRATE 25 MG PO TABS
25.0000 mg | ORAL_TABLET | Freq: Two times a day (BID) | ORAL | Status: DC
  Administered 2015-02-23: 25 mg via ORAL
  Filled 2015-02-23: qty 1

## 2015-02-23 MED ORDER — INSULIN ASPART 100 UNIT/ML ~~LOC~~ SOLN
0.0000 [IU] | SUBCUTANEOUS | Status: DC
  Administered 2015-02-23: 2 [IU] via SUBCUTANEOUS
  Administered 2015-02-23: 3 [IU] via SUBCUTANEOUS
  Administered 2015-02-23: 2 [IU] via SUBCUTANEOUS
  Administered 2015-02-24 (×2): 3 [IU] via SUBCUTANEOUS
  Administered 2015-02-24 (×3): 2 [IU] via SUBCUTANEOUS
  Administered 2015-02-24 – 2015-02-25 (×2): 3 [IU] via SUBCUTANEOUS
  Administered 2015-02-25 (×2): 2 [IU] via SUBCUTANEOUS
  Administered 2015-02-25: 1 [IU] via SUBCUTANEOUS
  Administered 2015-02-25: 2 [IU] via SUBCUTANEOUS
  Administered 2015-02-25: 5 [IU] via SUBCUTANEOUS
  Administered 2015-02-26: 7 [IU] via SUBCUTANEOUS
  Administered 2015-02-26: 3 [IU] via SUBCUTANEOUS
  Administered 2015-02-26: 1 [IU] via SUBCUTANEOUS

## 2015-02-23 MED ORDER — CETYLPYRIDINIUM CHLORIDE 0.05 % MT LIQD
7.0000 mL | Freq: Two times a day (BID) | OROMUCOSAL | Status: DC
  Administered 2015-02-23: 7 mL via OROMUCOSAL

## 2015-02-23 MED ORDER — METOPROLOL TARTRATE 25 MG PO TABS
25.0000 mg | ORAL_TABLET | Freq: Two times a day (BID) | ORAL | Status: DC
  Administered 2015-02-24: 25 mg via ORAL
  Filled 2015-02-23: qty 1

## 2015-02-23 MED ORDER — CHLORHEXIDINE GLUCONATE 0.12 % MT SOLN
15.0000 mL | Freq: Two times a day (BID) | OROMUCOSAL | Status: DC
  Administered 2015-02-23 – 2015-02-26 (×6): 15 mL via OROMUCOSAL
  Filled 2015-02-23 (×2): qty 15

## 2015-02-23 MED ORDER — JEVITY 1.2 CAL PO LIQD
1000.0000 mL | ORAL | Status: DC
Start: 2015-02-23 — End: 2015-02-26
  Administered 2015-02-23 – 2015-02-26 (×3): 1000 mL
  Filled 2015-02-23 (×5): qty 1000

## 2015-02-23 MED ORDER — CETYLPYRIDINIUM CHLORIDE 0.05 % MT LIQD
7.0000 mL | Freq: Two times a day (BID) | OROMUCOSAL | Status: DC
  Administered 2015-02-24 – 2015-02-25 (×4): 7 mL via OROMUCOSAL

## 2015-02-23 MED ORDER — CHLORHEXIDINE GLUCONATE 0.12 % MT SOLN
15.0000 mL | Freq: Two times a day (BID) | OROMUCOSAL | Status: DC
  Administered 2015-02-23: 15 mL via OROMUCOSAL

## 2015-02-23 MED ORDER — PRO-STAT SUGAR FREE PO LIQD
30.0000 mL | Freq: Every day | ORAL | Status: DC
  Administered 2015-02-23 – 2015-02-26 (×4): 30 mL
  Filled 2015-02-23 (×4): qty 30

## 2015-02-23 MED ORDER — SODIUM CHLORIDE 0.9 % IV SOLN
500.0000 mg | Freq: Two times a day (BID) | INTRAVENOUS | Status: DC
  Administered 2015-02-23 – 2015-02-26 (×7): 500 mg via INTRAVENOUS
  Filled 2015-02-23 (×8): qty 5

## 2015-02-23 NOTE — Progress Notes (Signed)
Initial Nutrition Assessment  DOCUMENTATION CODES:   Non-severe (moderate) malnutrition in context of chronic illness  INTERVENTION:   If pt unable to swallow recommend Initiate Jevity 1.2 @ 20 ml/hr via Cortrak tube and increase by 10 ml every 4 hours to goal rate of 50 ml/hr.   30 ml Prostat daily.    Tube feeding regimen provides 1540 kcal (>100% of needs), 81 grams of protein, and 972 ml of H2O.    NUTRITION DIAGNOSIS:   Malnutrition related to chronic illness as evidenced by mild depletion of body fat, mild depletion of muscle mass.  GOAL:   Patient will meet greater than or equal to 90% of their needs  MONITOR:   I & O's, Diet advancement, Labs, Weight trends  REASON FOR ASSESSMENT:   Low Braden   ASSESSMENT:   Pt with hx of DM, HTN, and ischemic stroke who was admitted with right hemiplegia and aphasia from home. Found to have large left frontoparietal ICH.   Daughter at bedside and provides hx. Pt unable to answer questions. Per daughter no recent weight changes although may have lost some after original stroke, appetite dipped a little after original stroke but had stabilized since. Pt had been on a dysphagia diet PTA (Dysphagia 1 with Honey thickened liquids). Lived at home with wife and family. Was able to ambulate with cane PTA. Pt could feed himself but sometimes wife helped him. Daughter also reports sometimes chokes between bites.  Pt discussed during ICU rounds and with RN.  Pt failed swallow evaluation. May need feeding tube.   Labs reviewed: sodium elevated (150) on 3% CBG's: 176-213  Diet Order:  Diet NPO time specified  Skin:  Reviewed, no issues  Last BM:  unknown  Height:   Ht Readings from Last 1 Encounters:  02/23/15 5\' 6"  (1.676 m)   Weight:   Wt Readings from Last 1 Encounters:  02/23/15 128 lb 1.4 oz (58.1 kg)   Ideal Body Weight:  64.5 kg  BMI:  Body mass index is 20.68 kg/(m^2).  Estimated Nutritional Needs:   Kcal:   1500-1700  Protein:  75-85 grams  Fluid:  >1.5 L/day  EDUCATION NEEDS:   No education needs identified at this time  Kendell BaneHeather Allien Melberg RD, LDN, CNSC [phone] Pager [phone] After Hours Pager

## 2015-02-23 NOTE — Progress Notes (Signed)
  Echocardiogram 2D Echocardiogram has been performed.  Keith Bradshaw, Keith Bradshaw 02/23/2015, 11:38 AM

## 2015-02-23 NOTE — Progress Notes (Signed)
STROKE TEAM PROGRESS NOTE   HISTORY 64 y/o M w/ PMHx of HTN, DM type II, h/o seizures and subarachnoid hemorrhage in 06/2013, brought to ED by EMS for right hemiplegia, aphasia, and left gaze preference. Per chart review, patient was at home, took a nap yesterday morning around 10 AM. At 1 PM, wife could not wake him from sleep at which time EMS was called. BP documented by EMS was 180/120. No report of recent head trauma, use of anticoagulants, or antiplatelets.He remained with SBP of 190/110 in the ED, was given 10 mg IV labetalol and started IV nicardipine.CT head in ED showed a large left frontoparietal intracerebral hemorrhage measuring up to 6 cm. 4 mm of left-to-right midline shift. No hydrocephalus or IV extension. Seen by neurosurgery, patient not considered a surgical candidate at this time.    He was admitted to the neuro ICU for further evaluation and treatment.   SUBJECTIVE (INTERVAL HISTORY) His family is at the bedside.  Overall the patient's condition is stable. HR tachycardic, BP stable on Cardene gtt. Does not follow commands, localizes pain and withdraws slightly w/ LUE and LLE.    OBJECTIVE Temp:  [97.8 F (36.6 C)-100.1 F (37.8 C)] 99.5 F (37.5 C) (11/09 0500) Pulse Rate:  [75-111] 109 (11/09 0700) Cardiac Rhythm:  [-] Sinus tachycardia (11/09 0700) Resp:  [5-26] 20 (11/09 0700) BP: (131-196)/(74-112) 143/83 mmHg (11/09 0700) SpO2:  [95 %-100 %] 99 % (11/09 0700) Weight:  [128 lb 1.4 oz (58.1 kg)] 128 lb 1.4 oz (58.1 kg) (11/09 0100)  CBC:  Recent Labs Lab 02/16/2015 1437 03/13/2015 1441 02/23/15 0613  WBC 8.1  --  12.3*  NEUTROABS 4.5  --   --   HGB 9.7* 10.5* 8.9*  HCT 30.2* 31.0* 26.9*  MCV 90.1  --  89.1  PLT 221  --  226    Basic Metabolic Panel:  Recent Labs Lab 03/10/2015 1437 02/15/2015 1441  02/23/15 0015 02/23/15 0613  NA 139 142  < > 141 145  K 4.7 4.7  --   --  3.9  CL 106 103  --   --  115*  CO2 26  --   --   --  23  GLUCOSE 163* 155*  --    --  195*  BUN 15 17  --   --  14  CREATININE 0.98 0.90  --   --  0.99  CALCIUM 9.4  --   --   --  8.6*  < > = values in this interval not displayed.   IMAGING  Ct Head Wo Contrast  02/23/2015  CLINICAL DATA:  Followup intracranial hemorrhage. EXAM: CT HEAD WITHOUT CONTRAST TECHNIQUE: Contiguous axial images were obtained from the base of the skull through the vertex without intravenous contrast. COMPARISON:  Prior CT from 02/25/2015. FINDINGS: Intraparenchymal hematoma centered at the left frontoparietal region/operculum is increased in size measuring 6.9 x 4.7 cm. Localized vasogenic edema is similar with persistent partial effacement of the left lateral ventricle and 4 mm of left-to-right shift. Associated subarachnoid hemorrhage present within the superior left sylvian fissure. There is new intraventricular hemorrhage with blood layering in the occipital horns of both lateral ventricles. Additionally, there is increased dilatation of the temporal horns of both lateral ventricles as compared to prior, suggesting developing hydrocephalus. No acute large vessel territory infarct. Atrophy with chronic microvascular ischemic disease present. Remote lacunar infarct within the right paramedian pons. Remote lacunar infarct present within the right basal ganglia as well. No  mass lesion. No extra-axial fluid collection. Scalp soft tissues demonstrate no acute abnormality. Orbits demonstrate no acute abnormality. Left-sided gaze noted. Mild mucosal thickening within the ethmoidal air cells. Paranasal sinuses are otherwise clear. No mastoid effusion. Middle ear cavities are clear. Calvarium intact. IMPRESSION: 1. Increased size of left frontoparietal parenchymal hematoma now measuring 6.9 x 4.7 cm. Similar localized vasogenic edema with persistent 4 mm of left-to-right shift. 2. New intraventricular extension with blood layering in the occipital horns of both lateral ventricles. Slight interval increase in  dilatation of the temporal horns suspicious for developing hydrocephalus. 3. Acute subarachnoid hemorrhage within the left sylvian fissure, unchanged. Electronically Signed   By: Rise Mu M.D.   On: 02/23/2015 05:36   Ct Head Wo Contrast  March 14, 2015  CLINICAL DATA:  Code stroke. Woke up after nap today with left side gaze and flaccid right arm. Nonverbal. EXAM: CT HEAD WITHOUT CONTRAST TECHNIQUE: Contiguous axial images were obtained from the base of the skull through the vertex without intravenous contrast. COMPARISON:  None. FINDINGS: There is a large left cerebral hemisphere bleed in the frontal parietal lobe. This measures 6.0 x 4.3 cm. Mass-effect with 4 mm of left-to-right midline shift. Probable small amount of adjacent subarachnoid No hydrocephalus. Chronic microvascular changes in the deep white matter. Mild cerebral atrophy. Visualized paranasal sinuses and mastoids clear. Orbital soft tissues unremarkable. IMPRESSION: Large left frontoparietal intra cerebral hemorrhage measuring up to 6 cm. 4 mm of left-to-right midline shift. Critical Value/emergent results were called by telephone at the time of interpretation on 03-14-2015 at 2:50 pm to Dr. Cyril Mourning , who verbally acknowledged these results. Electronically Signed   By: Charlett Nose M.D.   On: 14-Mar-2015 14:53   Dg Chest Port 1 View  03/14/2015  CLINICAL DATA:  Code stroke.  Evaluate central line placement. EXAM: PORTABLE CHEST 1 VIEW COMPARISON:  None FINDINGS: Both lungs are clear. Negative for pneumothorax. There is a right jugular central line that extends laterally into the right subclavian vein. Heart size is normal. There is a linear structure overlying the right scapula which is probably outside of the patient. This linear structure appears different from the central line. IMPRESSION: Right jugular central line extends into the right subclavian vein. Negative for pneumothorax. No focal chest disease. These results were called by  telephone at the time of interpretation on March 14, 2015 at 5:03 pm to [facility] , who verbally acknowledged these results. Electronically Signed   By: Richarda Overlie M.D.   On: 2015-03-14 17:07    PHYSICAL EXAM  General: Eyes open, in NAD. Coughing intermittently.  HEENT: PERRL, EOMI. Moist mucus membranes Neck: Full range of motion without pain, supple, no lymphadenopathy or carotid bruits Lungs: Clear to ascultation bilaterally, normal work of respiration, no wheezes, rales, rhonchi Heart: Tachycardic, no murmurs, gallops, or rubs Abdomen: Soft, non-tender, non-distended, BS + Extremities: No cyanosis, clubbing, or edema  Mental Status -  Decreased level of arousal, does not follow commands. . Language severely impaired. Attention span and concentration severely impaired. Recent and remote memory unable to assess. Fund of Knowledge unable to assess.  Cranial Nerves II - XII - unable to assess.   Motor Strength - RUE w/ rigidity/spasticity (chronic?). RLE unclear, does not withdraw to pain. LLE withdraws and localizes pain. LUE w/ spontaneous movement.  Motor Tone - Muscle tone was assessed at the neck and appendages and was normal.  Sensory - Deferred.    Coordination - Deferred.  Gait and Station - Deferred.  ASSESSMENT/PLAN 64 y/o M w/ PMHx of HTN, DM type II, h/o seizures and subarachnoid hemorrhage in 06/2013, admitted for large left frontoparietal hemorrhage.   Left Frontoparietal Parenchymal Hematoma with Subarachnoid Extension:    Resultant right hemiplegia, aphasia, and left gaze preference  CT Head: Left frontoparietal parenchymal hematoma measuring 6.9 x 4.7 cm. Similar localized  vasogenic edema with persistent 4 mm of left-to-right shift. New intraventricular extension with blood  layering in the occipital horns of both lateral ventricles. Dilation of the temporal horns suspicious for  developing hydrocephalus. Also significant for acute subarachnoid hemorrhage within the  left sylvian  fissure  2D Echo  EF 60-65%. Moderate thickening of noncoronary cusp of aortic valve. Cannot rule out  vegetation.  LDL Pending  HgbA1c Pending  SCD's for VTE prophylaxis  Diet NPO time specified  No antithrombotic prior to admission, now on No antithrombotic given ICH  NOT a surgical candidate based on location.   Ongoing aggressive stroke risk factor management  Continue 3% saline for goal Na of 150-155 in attempt to decrease possibility of worsening cerebral  edema.   Therapy recommendations:  Pending  Disposition:  Pending  Hypertension  Unstable  Tight BP control given ICH, continue Cardene gtt   Hyperlipidemia  Home meds:  Mevacor 20 mg on hold given NPO status  LDL pending, goal < 70  Diabetes  Mild hyperglycemia; started ISS q4h coverage  HgbA1c pending, goal < 7.0  Other Stroke Risk Factors  Hx stroke/TIA  Coronary artery disease    Hospital day # 1   Patient resting comfortably, no acute distress. Not following commands. Pupils equal and reactive. Large left frontoparietal hemorrhage with ventricular extension. Patient also w/ previous SAH 2/2 trauma per family. Patient at large risk of worsening cerebral edema and herniation. Continue 3% saline for Na goal of 150-155. Patient will likely need PEG, careful monitoring of respiratory status and possibly intubation. Frequent neuro checks.    Lauris Chroman, MD PGY-3, Internal Medicine Pager: [phone]    To contact Stroke Continuity provider, please refer to [email or web address]. After hours, contact General Neurology

## 2015-02-23 NOTE — Care Management Note (Signed)
Case Management Note  Patient Details  Name: Ashok Croonetra Burlison MRN: [id number] Date of Birth: 1950-11-24  Subjective/Objective:      Pt admitted on 02/24/2015 s/p large ICH.  PTA, pt independent,lives with spouse.  Pt speaks no [place].  He speaks [place], per report.                Action/Plan: Will follow for discharge plans as pt progresses.   Pt currently on bedrest, not medically ready for therapy at this time.  Will need PT/OT consults when able to tolerate therapy.    Expected Discharge Date:                  Expected Discharge Plan:  IP Rehab Facility  In-House Referral:     Discharge planning Services  CM Consult  Post Acute Care Choice:    Choice offered to:     DME Arranged:    DME Agency:     HH Arranged:    HH Agency:     Status of Service:  In process, will continue to follow  Medicare Important Message Given:    Date Medicare IM Given:    Medicare IM give by:    Date Additional Medicare IM Given:    Additional Medicare Important Message give by:     If discussed at Long Length of Stay Meetings, dates discussed:    Additional Comments:  Quintella BatonJulie W. Eugena Rhue, RN, BSN  Trauma/Neuro ICU Case Manager [phone]

## 2015-02-23 NOTE — Evaluation (Signed)
Clinical/Bedside Swallow Evaluation Patient Details  Name: Keith Bradshaw MRN: [id number] Date of Birth: [birth year]  Today's Date: 02/23/2015 Time: SLP Start Time (ACUTE ONLY): 0803 SLP Stop Time (ACUTE ONLY): 0822 SLP Time Calculation (min) (ACUTE ONLY): 19 min  Past Medical History:  Past Medical History  Diagnosis Date  . Hypertension   . Diabetes (HCC)   . Subarachnoid hemorrhage [facility]) March 2015  . Dysphagia March 2015    secondary to [facility]  . Seizures (HCC)     takes keppra   Past Surgical History:  Past Surgical History  Procedure Laterality Date  . No past surgeries     HPI:  Keith Bradshaw is a 64 y.o. right handed male who awoke from a nap aphasic, hemiplegic, and with a left gaze preference. He was brought to the ER because stroke was called and he was found to have a large (6cm with 4 mm left-right midline shift) left insular hemorrhage. 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 Per RN, placed on a regular diet with nectar thick liquids following previous CVA, however downgraded to honey thick liquids in 04/2014 for unknown reasons.    Assessment / Plan / Recommendation Clinical Impression  Bedside swallow evaluation complete. Patient presents with a severe dysphagia at this time. Exam limited by both decreased alertness, cognitive deficits, and what appears to be global aphasia impacting bolus awareness and ability to follow commands. Daughter present for exam and interpreting in patient's native language. Despite max cueing however, patient with little-no labial opening and little-no mandibular depression for oral acceptance of bolus. Ice chip placed in oral cavity but eventually manually removed by clinician due to lack of bolus manipulation. Educated daughter regarding results of exam and plan of care which will include f/u diagnostic treatment at bedside to determine readiness for instrumental testing given baseline dysphagia.     Aspiration Risk  Severe     Diet Recommendation NPO;Alternative means - temporary   Medication Administration: Via alternative means    Other  Recommendations Oral Care Recommendations: Oral care QID      Frequency and Duration min 3x week  2 weeks   Pertinent Vitals/Pain n/a     Swallow Study    General Other Pertinent Information: Keith Bradshaw is a 64 y.o. right handed male who awoke from a nap aphasic, hemiplegic, and with a left gaze preference. He was brought to the ER because stroke was called and he was found to have a large (6cm with 4 mm left-right midline shift) left insular hemorrhage. 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 Per RN, placed on a regular diet with nectar thick liquids following previous CVA, however downgraded to honey thick liquids in 04/2014 for unknown reasons.  Type of Study: Bedside swallow evaluation Previous Swallow Assessment: see HPI Diet Prior to this Study: NPO Temperature Spikes Noted: No Respiratory Status: Room air History of Recent Intubation: No Oral Cavity - Dentition: Adequate natural dentition/normal for age Self-Feeding Abilities: Total assist Patient Positioning: Upright in bed Baseline Vocal Quality: Not observed (non-verbal) Volitional Cough: Cognitively unable to elicit Volitional Swallow: Able to elicit    Oral/Motor/Sensory Function Overall Oral Motor/Sensory Function: Other (comment) (little-no labial movement, little mandibular depression)   Ice Chips Ice chips: Impaired Presentation: Spoon Oral Phase Impairments: Poor awareness of bolus;Other (comment) (reduced labial opening, mandibular depression ) Oral Phase Functional Implications: Other (comment) (no oral transit, bolus removed by clinician)   Thin Liquid Thin Liquid: Not tested  Nectar Thick Nectar Thick Liquid: Not tested   Honey Thick Honey Thick Liquid: Not tested   Puree Puree: Not tested   Solid   GO   Valmai Vandenberghe MA, CCC-SLP [phone]  Solid: Not  tested       Zailyn Rowser Meryl 02/23/2015,8:27 AM

## 2015-02-23 NOTE — Progress Notes (Signed)
PT Cancellation Note  Patient Details Name: Keith Bradshaw MRN: [id number] DOB: March 25, 1951   Cancelled Treatment:    Reason Eval/Treat Not Completed: Patient not medically ready.  Pt with current bedrest order.  Please advance activity order when appropriate for PT and mobility.     [name], Alison MurrayMegan F 02/23/2015, 8:14 AM

## 2015-02-24 ENCOUNTER — Inpatient Hospital Stay (HOSPITAL_COMMUNITY): Payer: 59

## 2015-02-24 DIAGNOSIS — Z87898 Personal history of other specified conditions: Secondary | ICD-10-CM

## 2015-02-24 DIAGNOSIS — Z4659 Encounter for fitting and adjustment of other gastrointestinal appliance and device: Secondary | ICD-10-CM | POA: Insufficient documentation

## 2015-02-24 DIAGNOSIS — Z8673 Personal history of transient ischemic attack (TIA), and cerebral infarction without residual deficits: Secondary | ICD-10-CM

## 2015-02-24 DIAGNOSIS — R1310 Dysphagia, unspecified: Secondary | ICD-10-CM

## 2015-02-24 LAB — GLUCOSE, CAPILLARY
GLUCOSE-CAPILLARY: 164 mg/dL — AB (ref 65–99)
GLUCOSE-CAPILLARY: 226 mg/dL — AB (ref 65–99)
Glucose-Capillary: 175 mg/dL — ABNORMAL HIGH (ref 65–99)
Glucose-Capillary: 209 mg/dL — ABNORMAL HIGH (ref 65–99)
Glucose-Capillary: 184 mg/dL — ABNORMAL HIGH (ref 65–99)
Glucose-Capillary: 222 mg/dL — ABNORMAL HIGH (ref 65–99)

## 2015-02-24 LAB — BASIC METABOLIC PANEL
ANION GAP: 9 (ref 5–15)
BUN: 20 mg/dL (ref 6–20)
CHLORIDE: 124 mmol/L — AB (ref 101–111)
CO2: 20 mmol/L — ABNORMAL LOW (ref 22–32)
Calcium: 8.2 mg/dL — ABNORMAL LOW (ref 8.9–10.3)
Creatinine, Ser: 1.19 mg/dL (ref 0.61–1.24)
GFR calc Af Amer: >60 mL/min (ref >60)
GLUCOSE: 235 mg/dL — AB (ref 65–99)
POTASSIUM: 2.9 mmol/L — AB (ref 3.5–5.1)
Sodium: 153 mmol/L — ABNORMAL HIGH (ref 135–145)

## 2015-02-24 LAB — CBC
HCT: 24.5 % — ABNORMAL LOW (ref 39.0–52.0)
HEMOGLOBIN: 7.8 g/dL — AB (ref 13.0–17.0)
MCH: 29.2 pg (ref 26.0–34.0)
MCHC: 31.8 g/dL (ref 30.0–36.0)
MCV: 91.8 fL (ref 78.0–100.0)
PLATELETS: 206 10*3/uL (ref 150–400)
RBC: 2.67 MIL/uL — AB (ref 4.22–5.81)
RDW: 13.5 % (ref 11.5–15.5)
WBC: 12.7 10*3/uL — AB (ref 4.0–10.5)

## 2015-02-24 LAB — HEMOGLOBIN A1C
HEMOGLOBIN A1C: 6.8 % — AB (ref 4.8–5.6)
Mean Plasma Glucose: 148 mg/dL

## 2015-02-24 LAB — SODIUM
SODIUM: 159 mmol/L — AB (ref 135–145)
Sodium: 154 mmol/L — ABNORMAL HIGH (ref 135–145)
Sodium: 161 mmol/L (ref 135–145)

## 2015-02-24 MED ORDER — METOPROLOL TARTRATE 50 MG PO TABS
50.0000 mg | ORAL_TABLET | Freq: Two times a day (BID) | ORAL | Status: DC
  Administered 2015-02-24 – 2015-02-26 (×5): 50 mg via ORAL
  Filled 2015-02-24 (×5): qty 1

## 2015-02-24 NOTE — Progress Notes (Signed)
RN went in for hourly rounding. Noted that cortrak had been partially removed by patient with tube feeds still running. Tube feeds stopped immediately. Lung sounds changed from clear to minimally rhonchus. Dr. Roseanne RenoStewart notified. Orders for portable chest xray and to reinsert feeding tube.  Attempted to get panda tube, and panda tubes removed from stock and cortrak placement unavailable at night. Will pass on to day RN to have tube replaced in morning. Patient does not receive any medications for the remainder of the shift.

## 2015-02-24 NOTE — Evaluation (Signed)
Occupational Therapy Evaluation Patient Details Name: Keith Bradshaw MRN: [id number] DOB: May 07, 1950 Today's Date: 02/24/2015    History of Present Illness Keith Bradshaw is a 64 y.o. right handed male, speaks [place], who awoke from a nap aphasic, hemiplegic, and with a left gaze preference. He was brought to the ER because stroke was called and he was found to have a large (6cm with 4 mm left-right midline shift) left insular hemorrhage. PMH of HTN, diabetes, and SAH in 2015 with residual dysphagia and Rt hemiparesis (pt ambulated with cane)   Clinical Impression   Pt admitted with above. He demonstrates the below listed deficits and will benefit from continued OT to maximize safety and independence with BADLs.  Pt essentially non responsive during eval - his only responsiveness was to flex Lt UE toward NG tube and actively extended elbow partially x 1.   Eyes remained closed, when lids lifted horizontal nystagmus noted bil.   Mod flexor spasticity noted Rt UE, and min flexor spasticity noted Lt.  He requires total A with all aspects of ADLs and +2 total A for  functional mobility.   He will likely require SNF level rehab.       Follow Up Recommendations  SNF;Supervision/Assistance - 24 hour    Equipment Recommendations  None recommended by OT    Recommendations for Other Services       Precautions / Restrictions Precautions Precautions: Fall Precaution Comments: Pt with increased spasticity bil.       Mobility Bed Mobility Overal bed mobility: Needs Assistance;+2 for physical assistance Bed Mobility: Rolling Rolling: Total assist;+2 for physical assistance         General bed mobility comments: Pt rolled Lt and Rt with total A +2.  Pt makes no attempt to assist   Transfers Overall transfer level: Needs assistance               General transfer comment: unable to safely attempt     Balance                                            ADL  Overall ADL's : Needs assistance/impaired Eating/Feeding: NPO   Grooming: Wash/dry hands;Wash/dry face;Oral care;Total assistance;Bed level   Upper Body Bathing: Total assistance;Bed level   Lower Body Bathing: Total assistance;Bed level   Upper Body Dressing : Total assistance;Bed level   Lower Body Dressing: Total assistance;Bed level   Toilet Transfer: Total assistance Toilet Transfer Details (indicate cue type and reason): unable  Toileting- Clothing Manipulation and Hygiene: Total assistance;Bed level       Functional mobility during ADLs: Total assistance;+2 for physical assistance General ADL Comments: Pt incontinent of stool.  Assisted in clean up supine level, minimal to no responsiveness noted during clean up      Vision Additional Comments: Pt keeps eyes closed, when lids lifted eyes he will look to Lt, horizontal nystagmus noted bil.    Perception     Praxis Praxis Praxis tested?: Deficits Deficits: Initiation    Pertinent Vitals/Pain Pain Assessment: Faces Faces Pain Scale: No hurt     Hand Dominance     Extremity/Trunk Assessment Upper Extremity Assessment Upper Extremity Assessment: RUE deficits/detail;LUE deficits/detail RUE Deficits / Details: Pt does not attempt to move Rt UE actively.  Keeps wrist flexed with fingers flexed.  Able to achieve full PROM elbow distally.  Shoulder flexion and abduction  limited to ~95*.   Moderate flexor spasticity noted.  RUE Coordination: decreased fine motor;decreased gross motor LUE Deficits / Details: Pt with minimal elbow flexion/extension of Lt UE actively noted.  Full PROM elbow distally with ~90* shoulder flexion passively .  Minimal flexor spasticity noted elbow, wrist and hand with moderate noted Lt shoulder  LUE Coordination: decreased fine motor;decreased gross motor   Lower Extremity Assessment Lower Extremity Assessment: Defer to PT evaluation   Cervical / Trunk Assessment Cervical / Trunk Assessment:  Other exceptions Cervical / Trunk Exceptions: pt with decreased isolated movement of trunk with bed mobility.  extensor patterns noted  initially when pt turned on Rt side.    Communication Communication Communication: Receptive difficulties;Expressive difficulties;Prefers language other than [place];Other (comment) (Per RN, pt not responding to [place] instruction )   Cognition Arousal/Alertness: Lethargic Behavior During Therapy: Flat affect Overall Cognitive Status: Impaired/Different from baseline Area of Impairment: Attention               General Comments: Pt with eyes closed, will open eyes minimally briefly and moves Lt UE toward NG tube x1, otherwise non responsive even with max stimulation.  Did not attempt interpreter line due to pt with eyes closed and not attempting to interact with environment.  Son in law present earlier today, per RN and reports that pt did not respond to his native language    General Comments       Exercises       Shoulder Instructions      Home Living Family/patient expects to be discharged to:: Skilled nursing facility Living Arrangements: Spouse/significant other;Children;Other relatives                               Additional Comments: Per chart review, pt lived with spouse, and ambulated with cane. No family present to provide further information        Prior Functioning/Environment          Comments: pt ambulated with cane per chart, no family present     OT Diagnosis: Generalized weakness;Cognitive deficits;Disturbance of vision;Hemiplegia non-dominant side;Hemiplegia dominant side   OT Problem List: Decreased strength;Decreased range of motion;Decreased activity tolerance;Impaired balance (sitting and/or standing);Impaired vision/perception;Decreased coordination;Decreased cognition;Decreased safety awareness;Decreased knowledge of use of DME or AE;Decreased knowledge of precautions;Impaired UE functional  use;Impaired tone   OT Treatment/Interventions: Self-care/ADL training;Therapeutic exercise;Neuromuscular education;DME and/or AE instruction;Therapeutic activities;Cognitive remediation/compensation;Visual/perceptual remediation/compensation;Patient/family education;Balance training;Splinting;Manual therapy    OT Goals(Current goals can be found in the care plan section) Acute Rehab OT Goals OT Goal Formulation: Patient unable to participate in goal setting Time For Goal Achievement: 03/10/15 ADL Goals Additional ADL Goal #1: Pt will maintain arousal x 5 mins  Additional ADL Goal #2: Pt family will be independent with PROM bil. UEs  Additional ADL Goal #3: Pt will wash face with max A   OT Frequency: Min 2X/week   Barriers to D/C: Decreased caregiver support  doubt family can provide necessary level of care        Co-evaluation PT/OT/SLP Co-Evaluation/Treatment: Yes Reason for Co-Treatment: Complexity of the patient's impairments (multi-system involvement);For patient/therapist safety   OT goals addressed during session: Strengthening/ROM      End of Session Nurse Communication: Mobility status  Activity Tolerance: Patient limited by lethargy Patient left: in bed;with call bell/phone within reach   Time: 1159-1222 OT Time Calculation (min): 23 min Charges:  OT General Charges $OT Visit: 1 Procedure OT Evaluation $Initial OT  Evaluation Tier I: 1 Procedure G-Codes:    Jeani Hawking M 12-Mar-2015, 12:50 PM

## 2015-02-24 NOTE — Progress Notes (Signed)
STROKE TEAM PROGRESS NOTE   HISTORY 64 y/o M w/ PMHx of HTN, DM type II, h/o seizures and subarachnoid hemorrhage in 06/2013, brought to ED by EMS for right hemiplegia, aphasia, and left gaze preference. Per chart review, patient was at home, took a nap yesterday morning around 10 AM. At 1 PM, wife could not wake him from sleep at which time EMS was called. BP documented by EMS was 180/120. No report of recent head trauma, use of anticoagulants, or antiplatelets.He remained with SBP of 190/110 in the ED, was given 10 mg IV labetalol and started IV nicardipine.CT head in ED showed a large left frontoparietal intracerebral hemorrhage measuring up to 6 cm. 4 mm of left-to-right midline shift. No hydrocephalus or IV extension. Seen by neurosurgery, patient not considered a surgical candidate at this time.    He was admitted to the neuro ICU for further evaluation and treatment.   SUBJECTIVE (INTERVAL HISTORY) No change in neuro exam today. BP stable, mild tachycardia.    OBJECTIVE Temp:  [99.2 F (37.3 C)-100.3 F (37.9 C)] 99.2 F (37.3 C) (11/10 0417) Pulse Rate:  [92-112] 96 (11/10 0700) Cardiac Rhythm:  [-] Normal sinus rhythm (11/10 0700) Resp:  [16-33] 16 (11/10 0700) BP: (119-149)/(65-88) 131/69 mmHg (11/10 0700) SpO2:  [93 %-100 %] 98 % (11/10 0700) Weight:  [130 lb 8.2 oz (59.2 kg)] 130 lb 8.2 oz (59.2 kg) (11/10 0500)  CBC:  Recent Labs Lab 04-28-14 1437  02/23/15 0613 02/24/15 0615  WBC 8.1  --  12.3* 12.7*  NEUTROABS 4.5  --   --   --   HGB 9.7*  < > 8.9* 7.8*  HCT 30.2*  < > 26.9* 24.5*  MCV 90.1  --  89.1 91.8  PLT 221  --  226 206  < > = values in this interval not displayed.  Basic Metabolic Panel:  Recent Labs Lab 02/23/15 0613  02/24/15 0037 02/24/15 0615  NA 145  < > 154* 153*  K 3.9  --   --  2.9*  CL 115*  --   --  124*  CO2 23  --   --  20*  GLUCOSE 195*  --   --  235*  BUN 14  --   --  20  CREATININE 0.99  --   --  1.19  CALCIUM 8.6*  --   --   8.2*  < > = values in this interval not displayed.   IMAGING  Ct Head Wo Contrast  02/23/2015  CLINICAL DATA:  Followup intracranial hemorrhage. EXAM: CT HEAD WITHOUT CONTRAST TECHNIQUE: Contiguous axial images were obtained from the base of the skull through the vertex without intravenous contrast. COMPARISON:  Prior CT from 07/19/2014. FINDINGS: Intraparenchymal hematoma centered at the left frontoparietal region/operculum is increased in size measuring 6.9 x 4.7 cm. Localized vasogenic edema is similar with persistent partial effacement of the left lateral ventricle and 4 mm of left-to-right shift. Associated subarachnoid hemorrhage present within the superior left sylvian fissure. There is new intraventricular hemorrhage with blood layering in the occipital horns of both lateral ventricles. Additionally, there is increased dilatation of the temporal horns of both lateral ventricles as compared to prior, suggesting developing hydrocephalus. No acute large vessel territory infarct. Atrophy with chronic microvascular ischemic disease present. Remote lacunar infarct within the right paramedian pons. Remote lacunar infarct present within the right basal ganglia as well. No mass lesion. No extra-axial fluid collection. Scalp soft tissues demonstrate no acute abnormality. Orbits demonstrate  no acute abnormality. Left-sided gaze noted. Mild mucosal thickening within the ethmoidal air cells. Paranasal sinuses are otherwise clear. No mastoid effusion. Middle ear cavities are clear. Calvarium intact. IMPRESSION: 1. Increased size of left frontoparietal parenchymal hematoma now measuring 6.9 x 4.7 cm. Similar localized vasogenic edema with persistent 4 mm of left-to-right shift. 2. New intraventricular extension with blood layering in the occipital horns of both lateral ventricles. Slight interval increase in dilatation of the temporal horns suspicious for developing hydrocephalus. 3. Acute subarachnoid hemorrhage  within the left sylvian fissure, unchanged. Electronically Signed   By: Rise Mu M.D.   On: 02/23/2015 05:36   Ct Head Wo Contrast  Feb 26, 2015  CLINICAL DATA:  Code stroke. Woke up after nap today with left side gaze and flaccid right arm. Nonverbal. EXAM: CT HEAD WITHOUT CONTRAST TECHNIQUE: Contiguous axial images were obtained from the base of the skull through the vertex without intravenous contrast. COMPARISON:  None. FINDINGS: There is a large left cerebral hemisphere bleed in the frontal parietal lobe. This measures 6.0 x 4.3 cm. Mass-effect with 4 mm of left-to-right midline shift. Probable small amount of adjacent subarachnoid No hydrocephalus. Chronic microvascular changes in the deep white matter. Mild cerebral atrophy. Visualized paranasal sinuses and mastoids clear. Orbital soft tissues unremarkable. IMPRESSION: Large left frontoparietal intra cerebral hemorrhage measuring up to 6 cm. 4 mm of left-to-right midline shift. Critical Value/emergent results were called by telephone at the time of interpretation on 02-26-15 at 2:50 pm to Dr. Cyril Mourning , who verbally acknowledged these results. Electronically Signed   By: Charlett Nose M.D.   On: 02/26/15 14:53   Dg Chest Port 1 View  02-26-2015  CLINICAL DATA:  Code stroke.  Evaluate central line placement. EXAM: PORTABLE CHEST 1 VIEW COMPARISON:  None FINDINGS: Both lungs are clear. Negative for pneumothorax. There is a right jugular central line that extends laterally into the right subclavian vein. Heart size is normal. There is a linear structure overlying the right scapula which is probably outside of the patient. This linear structure appears different from the central line. IMPRESSION: Right jugular central line extends into the right subclavian vein. Negative for pneumothorax. No focal chest disease. These results were called by telephone at the time of interpretation on 2015/02/26 at 5:03 pm to [facility] , who verbally acknowledged these  results. Electronically Signed   By: Richarda Overlie M.D.   On: 02-26-2015 17:07   Dg Abd Portable 1v  02/23/2015  CLINICAL DATA:  Nasogastric tube placement EXAM: PORTABLE ABDOMEN - 1 VIEW COMPARISON:  Portable exam 1625 hours without priors for comparison. FINDINGS: Tip of feeding tube projects over the distal second portion of the duodenum. Bowel gas pattern normal. Bones demineralized. IMPRESSION: Tip of feeding tube projects over distal second portion of duodenum. Electronically Signed   By: Ulyses Southward M.D.   On: 02/23/2015 16:29    PHYSICAL EXAM  General: Eyes open, in NAD. Coughing intermittently.  HEENT: PERRL, EOMI. Moist mucus membranes. NGT in place.  Neck: Full range of motion without pain, supple, no lymphadenopathy or carotid bruits Lungs: Clear to ascultation bilaterally, normal work of respiration, no wheezes, rales, rhonchi Heart: Tachycardic, no murmurs, gallops, or rubs Abdomen: Soft, non-tender, non-distended, BS + Extremities: No cyanosis, clubbing, or edema  Mental Status -  Decreased level of arousal, does not follow commands. . Language severely impaired. Attention span and concentration severely impaired. Recent and remote memory unable to assess. Fund of Knowledge unable to assess.  Cranial Nerves II -  XII - unable to assess.   Motor Strength - RUE w/ rigidity/spasticity (chronic?). RLE unclear, does not withdraw to pain. LLE withdraws and localizes pain. LUE w/ spontaneous movement.  Motor Tone - Muscle tone was assessed at the neck and appendages and was normal.  Sensory - Deferred.    Coordination - Deferred.  Gait and Station - Deferred.     ASSESSMENT/PLAN 64 y/o M w/ PMHx of HTN, DM type II, h/o seizures and subarachnoid hemorrhage in 06/2013, admitted for large left frontoparietal hemorrhage.   Left Frontoparietal Parenchymal Hematoma with Subarachnoid Extension:    Resultant right hemiplegia, aphasia, and left gaze preference  CT Head: Left  frontoparietal parenchymal hematoma measuring 6.9 x 4.7 cm. Similar localized  vasogenic edema with persistent 4 mm of left-to-right shift. New intraventricular extension with blood  layering in the occipital horns of both lateral ventricles. Dilation of the temporal horns suspicious for  developing hydrocephalus. Also significant for acute subarachnoid hemorrhage within the left sylvian  fissure  2D Echo  EF 60-65%. Moderate thickening of noncoronary cusp of aortic valve. Cannot rule out  vegetation.  LDL Pending  HgbA1c 6.8  SCD's for VTE prophylaxis Diet NPO time specified  No antithrombotic prior to admission, now on No antithrombotic given ICH  NOT a surgical candidate based on location.   Ongoing aggressive stroke risk factor management  Continue 3% saline for goal Na of 150-155 in attempt to decrease possibility of worsening cerebral  edema.   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 Has been on Keppra 250 mg bid at  home.   Continue Keppra 500 mg IV bid for now for seizure prophylaxis.   Hypertension  Tight BP control given ICH, continue Cardene gtt, try to wean off.   Increase Metoprolol to 50 mg bid  Hyperlipidemia  Home meds:  Mevacor 20 mg on hold given NPO status  LDL pending, goal < 70  Diabetes  Mild hyperglycemia; continue ISS q4h coverage  HgbA1c 6.8, goal < 7.0  Other Stroke Risk Factors  Hx stroke/TIA  Coronary artery disease    Hospital day # 2   No significant change in neuro exam. Still requiring Cardene for BP control. Will increase Metoprolol to 50 mg bid. Na 156 yesterday evening, 3% saline discontinued temporarily. Will restart #5 for now with goal of 150-155. NGT in place, tube feeds.    Lauris Chroman, MD PGY-3, Internal Medicine Pager: [phone]    To contact Stroke Continuity provider, please refer to [email or web address]. After hours, contact  General Neurology

## 2015-02-24 NOTE — Progress Notes (Signed)
Speech Language Pathology Treatment: Dysphagia  Patient Details Name: Keith Bradshaw MRN: [id number] DOB: 11-27-1950 Today's Date: 02/24/2015 Time: [id number] SLP Time Calculation (min) (ACUTE ONLY): 10 min  Assessment / Plan / Recommendation Clinical Impression  F/u after yesterday's bedside swallow assessment: no family present. Pt does not speak English per notes.  Pt aroused to voice; left gaze preference.  Provided oral care.  Offered ice chips, tspns water, which pt accepted - however, there was minimal oral manipulation, passive transfer to posterior mouth.  Eventual swallow response occurred, followed by explosive coughing, concerning for aspiration.  Pt with baseline dysphagia with exacerbation s/p acute hemorrhage. Has TF.  Recommend continuing NPO; SLP will follow for readiness for instrumental swallow study and speech/language evaluation.     HPI HPI: Keith Bradshaw is a 64 y.o. right handed male, speaks [place],  who awoke from a nap aphasic, hemiplegic, and with a left gaze preference. He was brought to the ER because stroke was called and he was found to have a large (6cm with 4 mm left-right midline shift) left insular hemorrhage. 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 Per RN, placed on a regular diet with nectar thick liquids following previous CVA, however downgraded to honey thick liquids in 04/2014 for unknown reasons.       SLP Plan  Continue with current plan of care     Recommendations  Diet recommendations: NPO              Oral Care Recommendations: Oral care QID Plan: Continue with current plan of care   Blenda MountsCouture, Keith Zenk Laurice 02/24/2015, 11:02 AM

## 2015-02-24 NOTE — Evaluation (Signed)
Physical Therapy Evaluation Patient Details Name: Keith Bradshaw MRN: [id number] DOB: 12-Aug-1950 Today's Date: 02/24/2015   History of Present Illness  Keith Bradshaw is a 64 y.o. right handed male, speaks [place], who awoke from a nap aphasic, hemiplegic, and with a left gaze preference. He was brought to the ER because stroke was called and he was found to have a large (6cm with 4 mm left-right midline shift) left insular hemorrhage. PMH of HTN, diabetes, and SAH in 2015 with residual dysphagia and Rt hemiparesis (pt ambulated with cane)  Clinical Impression  Pt with very minimal responsiveness throughout session.  For most of session pt kept eyes closed and was only observed opening eyes x2 for a couple seconds then closing them again.  Pt with moderate to severe flexor tone in Bil LEs and while assessing LEs it was noted UEs began to position like decerebrate posturing at one point, but did not sustain.  When pt's eyes held open L beating horizontal nystagmus noted.  Will continue to follow.      Follow Up Recommendations SNF    Equipment Recommendations  None recommended by PT    Recommendations for Other Services       Precautions / Restrictions Precautions Precautions: Fall Precaution Comments: Pt with increased spasticity bil.  Restrictions Weight Bearing Restrictions: No      Mobility  Bed Mobility Overal bed mobility: Needs Assistance;+2 for physical assistance Bed Mobility: Rolling Rolling: Total assist;+2 for physical assistance         General bed mobility comments: Pt rolled Lt and Rt with total A +2.  Pt makes no attempt to assist   Transfers Overall transfer level: Needs assistance               General transfer comment: unable to safely attempt   Ambulation/Gait                Stairs            Wheelchair Mobility    Modified Rankin (Stroke Patients Only) Modified Rankin (Stroke Patients Only) Pre-Morbid Rankin Score: Slight  disability (Per chart review.) Modified Rankin: Severe disability     Balance                                             Pertinent Vitals/Pain Pain Assessment: Faces Faces Pain Scale: No hurt    Home Living Family/patient expects to be discharged to:: Skilled nursing facility Living Arrangements: Spouse/significant other;Children;Other relatives               Additional Comments: Per chart review, pt lived with spouse, and ambulated with cane. No family present to provide further information      Prior Function           Comments: pt ambulated with cane per chart, no family present      Hand Dominance        Extremity/Trunk Assessment   Upper Extremity Assessment: Defer to OT evaluation RUE Deficits / Details: Pt does not attempt to move Rt UE actively.  Keeps wrist flexed with fingers flexed.  Able to achieve full PROM elbow distally.  Shoulder flexion and abduction limited to ~95*.   Moderate flexor spasticity noted.      LUE Deficits / Details: Pt with minimal elbow flexion/extension of Lt UE actively noted.  Full PROM elbow distally with ~90* shoulder  flexion passively .  Minimal flexor spasticity noted elbow, wrist and hand with moderate noted Lt shoulder    Lower Extremity Assessment: RLE deficits/detail;LLE deficits/detail RLE Deficits / Details: No active movement noted.  Increased flexor tone and difficulty breaking up tone to allow for extension of LE.  pt with delayed, but intact pain sesnation with small withdrawl to painful stimuli.   LLE Deficits / Details: No active movement noted.  pt with extensive flexor tone throughout LE, which is worse than R LE.  pt maintains LE in at least 90 degrees of hip and knee flexion with internal rotation.  Only minimally able to extend LE past the 90 degrees of flexion.  Pain sensation also intact, but delayed.    Cervical / Trunk Assessment: Other exceptions  Communication   Communication:  Receptive difficulties;Expressive difficulties;Prefers language other than [place];Other (comment) (Per RN, pt not responding to [place] instruction )  Cognition Arousal/Alertness: Lethargic Behavior During Therapy: Flat affect Overall Cognitive Status: Impaired/Different from baseline Area of Impairment: Attention               General Comments: Pt with eyes closed, will open eyes minimally briefly and moves Lt UE toward NG tube x1, otherwise non responsive even with max stimulation.  Did not attempt interpreter line due to pt with eyes closed and not attempting to interact with environment.  Son in law present earlier today, per RN and reports that pt did not respond to his native language     General Comments General comments (skin integrity, edema, etc.): VSS throughout eval.  Pt assisted with peri care in supine, pt responded only very minimally by flexing lt elbow to move hand toward NG tube, but otherwise not other responses noted throughout eval     Exercises        Assessment/Plan    PT Assessment Patient needs continued PT services  PT Diagnosis Hemiplegia dominant side   PT Problem List Decreased strength;Decreased range of motion;Decreased activity tolerance;Decreased balance;Decreased mobility;Decreased coordination;Decreased cognition;Decreased knowledge of use of DME;Impaired tone  PT Treatment Interventions DME instruction;Gait training;Functional mobility training;Therapeutic activities;Therapeutic exercise;Balance training;Neuromuscular re-education;Cognitive remediation;Patient/family education   PT Goals (Current goals can be found in the Care Plan section) Acute Rehab PT Goals Patient Stated Goal: Unable to state. PT Goal Formulation: Patient unable to participate in goal setting Time For Goal Achievement: 03/10/15 Potential to Achieve Goals: Fair    Frequency Min 3X/week   Barriers to discharge        Co-evaluation PT/OT/SLP Co-Evaluation/Treatment:  Yes Reason for Co-Treatment: Complexity of the patient's impairments (multi-system involvement);For patient/therapist safety PT goals addressed during session: Mobility/safety with mobility OT goals addressed during session: Strengthening/ROM       End of Session   Activity Tolerance: Patient limited by lethargy Patient left: in bed;with call bell/phone within reach;with bed alarm set Nurse Communication: Mobility status;Need for lift equipment         Time: [id number] PT Time Calculation (min) (ACUTE ONLY): 24 min   Charges:   PT Evaluation $Initial PT Evaluation Tier I: 1 Procedure     PT G CodesSunny Schlein:        Melek Pownall F, [place]PT [phone] 02/24/2015, 2:05 PM

## 2015-02-25 ENCOUNTER — Inpatient Hospital Stay (HOSPITAL_COMMUNITY): Payer: 59

## 2015-02-25 LAB — BASIC METABOLIC PANEL
BUN: 24 mg/dL — AB (ref 6–20)
BUN: 33 mg/dL — AB (ref 6–20)
CALCIUM: 8.1 mg/dL — AB (ref 8.9–10.3)
CO2: 22 mmol/L (ref 22–32)
CO2: 21 mmol/L — AB (ref 22–32)
CREATININE: 1.19 mg/dL (ref 0.61–1.24)
CREATININE: 1.23 mg/dL (ref 0.61–1.24)
Calcium: 7.5 mg/dL — ABNORMAL LOW (ref 8.9–10.3)
Chloride: >130 mmol/L (ref 101–111)
GFR calc Af Amer: >60 mL/min (ref >60)
GFR calc Af Amer: >60 mL/min (ref >60)
GFR calc non Af Amer: >60 mL/min (ref >60)
GFR calc non Af Amer: 60 mL/min — ABNORMAL LOW (ref >60)
GLUCOSE: 172 mg/dL — AB (ref 65–99)
GLUCOSE: 228 mg/dL — AB (ref 65–99)
POTASSIUM: 3.3 mmol/L — AB (ref 3.5–5.1)
Potassium: 2.8 mmol/L — ABNORMAL LOW (ref 3.5–5.1)
SODIUM: 162 mmol/L — AB (ref 135–145)
Sodium: 161 mmol/L (ref 135–145)

## 2015-02-25 LAB — CBC
HCT: 25.7 % — ABNORMAL LOW (ref 39.0–52.0)
HEMOGLOBIN: 8 g/dL — AB (ref 13.0–17.0)
MCH: 28.7 pg (ref 26.0–34.0)
MCHC: 31.1 g/dL (ref 30.0–36.0)
MCV: 92.1 fL (ref 78.0–100.0)
Platelets: 214 10*3/uL (ref 150–400)
RBC: 2.79 MIL/uL — ABNORMAL LOW (ref 4.22–5.81)
RDW: 14 % (ref 11.5–15.5)
WBC: 17.1 10*3/uL — ABNORMAL HIGH (ref 4.0–10.5)

## 2015-02-25 LAB — GLUCOSE, CAPILLARY
GLUCOSE-CAPILLARY: 243 mg/dL — AB (ref 65–99)
Glucose-Capillary: 160 mg/dL — ABNORMAL HIGH (ref 65–99)
Glucose-Capillary: 116 mg/dL — ABNORMAL HIGH (ref 65–99)
Glucose-Capillary: 172 mg/dL — ABNORMAL HIGH (ref 65–99)
Glucose-Capillary: 138 mg/dL — ABNORMAL HIGH (ref 65–99)
Glucose-Capillary: 153 mg/dL — ABNORMAL HIGH (ref 65–99)
Glucose-Capillary: 252 mg/dL — ABNORMAL HIGH (ref 65–99)

## 2015-02-25 LAB — SODIUM
Sodium: 161 mmol/L (ref 135–145)
Sodium: 162 mmol/L (ref 135–145)

## 2015-02-25 LAB — PHOSPHORUS: Phosphorus: 1.8 mg/dL — ABNORMAL LOW (ref 2.5–4.6)

## 2015-02-25 MED ORDER — POTASSIUM PHOSPHATES 15 MMOLE/5ML IV SOLN
20.0000 mmol | Freq: Once | INTRAVENOUS | Status: DC
  Filled 2015-02-25: qty 6.67

## 2015-02-25 MED ORDER — SODIUM CHLORIDE 0.9 % IV SOLN
INTRAVENOUS | Status: DC
  Administered 2015-02-25 (×2): via INTRAVENOUS

## 2015-02-25 MED ORDER — POTASSIUM CHLORIDE 20 MEQ/15ML (10%) PO SOLN
40.0000 meq | Freq: Once | ORAL | Status: AC
  Administered 2015-02-25: 40 meq via ORAL
  Filled 2015-02-25: qty 30

## 2015-02-25 MED ORDER — FREE WATER
60.0000 mL | Status: DC
  Administered 2015-02-25 – 2015-02-26 (×4): 60 mL

## 2015-02-25 MED ORDER — POTASSIUM PHOSPHATES 15 MMOLE/5ML IV SOLN
20.0000 mmol | Freq: Once | INTRAVENOUS | Status: AC
  Administered 2015-02-25: 20 mmol via INTRAVENOUS
  Filled 2015-02-25: qty 6.67

## 2015-02-25 NOTE — Progress Notes (Signed)
SLP Cancellation Note  Patient Details Name: Ashok Croonetra Bensen MRN: [id number] DOB: Mar 02, 1951   Cancelled treatment:       Reason Eval/Treat Not Completed: Fatigue/lethargy limiting ability to participate  Ferdinand LangoLeah Elisheva Fallas MA, CCC-SLP [phone]  Ferdinand LangoMcCoy Dolores Mcgovern Meryl 02/25/2015, 8:37 AM

## 2015-02-25 NOTE — Progress Notes (Signed)
Patient accidentally disconnected from cardene line from approx 120-330.

## 2015-02-25 NOTE — Progress Notes (Addendum)
CRITICAL VALUE ALERT  Critical value received:  Sodium 161, Chloride >130  Date of notification:  02/25/2015  Time of notification:  18:54  Critical value read back:Yes.    Nurse who received alert:  B. Marjo Bickerulbertson RN  MD notified (1st page):  Dr. Cyril Mourningamillo via text page   Time of first page: 18:57  Responding MD: Dr. Roseanne RenoStewart   Time MD responded: 19:00  Orders received for free water via NGT

## 2015-02-25 NOTE — Progress Notes (Signed)
CRITICAL VALUE ALERT  Critical value received:  Na 162 Cl >130  Date of notification:  11/11  Time of notification:  0629  Critical value read back:Yes.    Nurse who received alert:  Juleen Starraryn Tonjia Parillo, RN  MD notified (1st page):  Dr. Roseanne RenoStewart  Time of first page:  629  MD notified (2nd page):  Time of second page:  Responding MD:  Dr. Roseanne RenoStewart  Time MD responded:  0630   Orders received to increase NS to 5375ml/hr

## 2015-02-25 NOTE — Progress Notes (Signed)
STROKE TEAM PROGRESS NOTE   HISTORY 64 y/o M w/ PMHx of HTN, DM type II, h/o seizures and subarachnoid hemorrhage in 06/2013, brought to ED by EMS for right hemiplegia, aphasia, and left gaze preference. Per chart review, patient was at home, took a nap yesterday morning around 10 AM. At 1 PM, wife could not wake him from sleep at which time EMS was called. BP documented by EMS was 180/120. No report of recent head trauma, use of anticoagulants, or antiplatelets.He remained with SBP of 190/110 in the ED, was given 10 mg IV labetalol and started IV nicardipine.CT head in ED showed a large left frontoparietal intracerebral hemorrhage measuring up to 6 cm. 4 mm of left-to-right midline shift. No hydrocephalus or IV extension. Seen by neurosurgery, patient not considered a surgical candidate at this time.    He was admitted to the neuro ICU for further evaluation and treatment.   SUBJECTIVE (INTERVAL HISTORY) No significant change in neuro exam. Na ~160 overnight, NGT also came out, replaced this AM.    OBJECTIVE Temp:  [98.8 F (37.1 C)-99.4 F (37.4 C)] 99.2 F (37.3 C) (11/11 0400) Pulse Rate:  [83-102] 92 (11/11 0530) Cardiac Rhythm:  [-] Normal sinus rhythm (11/10 2000) Resp:  [16-27] 18 (11/11 0530) BP: (124-159)/(64-112) 127/68 mmHg (11/11 0530) SpO2:  [94 %-100 %] 96 % (11/11 0530) Weight:  [129 lb 10.1 oz (58.8 kg)] 129 lb 10.1 oz (58.8 kg) (11/11 0310)  CBC:  Recent Labs Lab 03/12/2015 1437  02/24/15 0615 02/25/15 0535  WBC 8.1  < > 12.7* 17.1*  NEUTROABS 4.5  --   --   --   HGB 9.7*  < > 7.8* 8.0*  HCT 30.2*  < > 24.5* 25.7*  MCV 90.1  < > 91.8 92.1  PLT 221  < > 206 214  < > = values in this interval not displayed.  Basic Metabolic Panel:  Recent Labs Lab 02/24/15 0615  02/25/15 0031 02/25/15 0535  NA 153*  < > 161* 162*  K 2.9*  --   --  2.8*  CL 124*  --   --  >130*  CO2 20*  --   --  22  GLUCOSE 235*  --   --  172*  BUN 20  --   --  24*  CREATININE 1.19  --    --  1.19  CALCIUM 8.2*  --   --  8.1*  < > = values in this interval not displayed.   IMAGING  Dg Chest Port 1 View  02/25/2015  CLINICAL DATA:  Aspiration into airway EXAM: PORTABLE CHEST 1 VIEW COMPARISON:  Yesterday FINDINGS: Laterally directed right neck catheter has unchanged malpositioned appearance, with tip likely in the right subclavian vein. Interval removal of a feeding tube. There is no edema, consolidation, effusion, or pneumothorax. Normal heart size and stable mediastinal contours. IMPRESSION: Clear lungs.  No evidence of aspiration pneumonitis. Electronically Signed   By: Marnee Spring M.D.   On: 02/25/2015 03:20   Dg Chest Port 1 View  02/24/2015  CLINICAL DATA:  Altered mental status, subarachnoid hemorrhage, seizures EXAM: PORTABLE CHEST 1 VIEW COMPARISON:  03/02/2015 FINDINGS: Lungs are clear.  No pleural effusion or pneumothorax. The heart is normal in size. Stable right IJ venous catheter terminating in the right subclavian vein. Enteric tube courses into the stomach. IMPRESSION: No evidence of acute cardiopulmonary disease. Stable right IJ venous catheter terminating in the right subclavian vein. Enteric tube courses into the stomach.  Electronically Signed   By: Charline BillsSriyesh  Krishnan M.D.   On: 02/24/2015 11:31   Dg Abd Portable 1v  02/23/2015  CLINICAL DATA:  Nasogastric tube placement EXAM: PORTABLE ABDOMEN - 1 VIEW COMPARISON:  Portable exam 1625 hours without priors for comparison. FINDINGS: Tip of feeding tube projects over the distal second portion of the duodenum. Bowel gas pattern normal. Bones demineralized. IMPRESSION: Tip of feeding tube projects over distal second portion of duodenum. Electronically Signed   By: Ulyses SouthwardMark  Boles M.D.   On: 02/23/2015 16:29    PHYSICAL EXAM  General: Eyes open, in NAD. Coughing intermittently.  HEENT: PERRL, EOMI. Moist mucus membranes. NGT in place.  Neck: Full range of motion without pain, supple, no lymphadenopathy or carotid  bruits Lungs: Clear to ascultation bilaterally, normal work of respiration, no wheezes, rales, rhonchi Heart: Tachycardic, no murmurs, gallops, or rubs Abdomen: Soft, non-tender, non-distended, BS + Extremities: No cyanosis, clubbing, or edema  Mental Status -  Decreased level of arousal, does not follow commands. . Language severely impaired. Attention span and concentration severely impaired. Recent and remote memory unable to assess. Fund of Knowledge unable to assess.  Cranial Nerves II - XII - unable to assess.   Motor Strength - RUE w/ rigidity/spasticity (chronic?). RLE unclear, does not withdraw to pain. LLE withdraws and localizes pain. LUE w/ spontaneous movement.  Motor Tone - Muscle tone was assessed at the neck and appendages and was normal.  Sensory - Deferred.    Coordination - Deferred.  Gait and Station - Deferred.     ASSESSMENT/PLAN 64 y/o M w/ PMHx of HTN, DM type II, h/o seizures and subarachnoid hemorrhage in 06/2013, admitted for large left frontoparietal hemorrhage.   Left Frontoparietal Parenchymal Hematoma with Subarachnoid Extension:    Resultant right hemiplegia, aphasia, and left gaze preference  CT Head: Left frontoparietal parenchymal hematoma measuring 6.9 x 4.7 cm. Similar localized  vasogenic edema with persistent 4 mm of left-to-right shift. New intraventricular extension with blood  layering in the occipital horns of both lateral ventricles. Dilation of the temporal horns suspicious for  developing hydrocephalus. Also significant for acute subarachnoid hemorrhage within the left sylvian  fissure  2D Echo  EF 60-65%. Moderate thickening of noncoronary cusp of aortic valve. Cannot rule out  vegetation.  LDL Pending  HgbA1c 6.8  SCD's for VTE prophylaxis Diet NPO time specified  No antithrombotic prior to admission, now on No antithrombotic given ICH  NOT a surgical candidate based on location.   Ongoing aggressive stroke risk factor  management  Hold 3% saline given Na 161. Goal 150-155.   Therapy recommendations:  SNF  Disposition:  Pending  Hypokalemia  K 2.8 this AM, Cl >130. Phos also low  Will give KPhos 20 mmol (will provide ~30 mEq K) + KCl 40 mEq po. Discussed with pharmacy, will  given Kphos in NS.  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 Has been on Keppra 250 mg bid at  home.   Continue Keppra 500 mg IV bid for now for seizure prophylaxis.   Hypertension  Tight BP control given ICH, continue Cardene gtt, try to wean off.   Continue Metoprolol to 50 mg bid  Hyperlipidemia  Home meds:  Mevacor 20 mg on hold given NPO status  LDL pending, goal < 70  Diabetes  Mild hyperglycemia; continue ISS q4h coverage  HgbA1c 6.8, goal < 7.0  Other Stroke Risk Factors  Hx  stroke/TIA  Coronary artery disease    Hospital day # 3   No significant change in neuro status. Na high at 161 this AM, 3% saline stopped overnight. NGT came out, replaced this AM. Supplementing K. Attempt to wean Cardene given increased Metoprolol dose.     Lauris Chroman, MD PGY-3, Internal Medicine Pager: [phone]  Personally examined patient and images, and have participated in and made any corrections needed to history, physical, neuro exam,assessment and plan as stated above.  I have personally obtained the history, evaluated lab date, reviewed imaging studies and agree with radiology interpretations.   Pt neuro stable,Neuro exam similar to yesterday.  Currently on tube feeding. Increased BP meds from NG tube and wean off cardene as able. Hold 3% saline given Na 161. Goal 150-155, now at goal, adjust as needed. Supplement K+.   This patient is critically ill due to large left ICH, with IVH, baseline dysphagia, right hemiparesis and at significant risk of neurological worsening, death form hematoma enlargement, hydrocephalus, respiratory failure. This patient's care  requires constant monitoring of vital signs, hemodynamics, respiratory and cardiac monitoring, review of multiple databases, neurological assessment, discussion with family, other specialists and medical decision making of high complexity. I spent 35 minutes of neurocritical care time in the care of this patient.  Naomie Dean, MD Stroke Neurology [phone] [facility]       To contact Stroke Continuity provider, please refer to [email or web address]. After hours, contact General Neurology

## 2015-02-25 NOTE — Progress Notes (Addendum)
CRITICAL VALUE ALERT  Critical value received:  Sodium 162  Date of notification:  02/25/2015  Time of notification:  12:47  Critical value read back:Yes.    Nurse who received alert:  B. Marjo Bickerulbertson RN   MD notified (1st page):  Dr. Yetta BarreJones  Time of first page:  12:50  MD notified (2nd page):   Time of second page:  Responding MD: Dr. Yetta BarreJones  Time MD responded:  12:53  Expected value, no new orders received.

## 2015-02-26 ENCOUNTER — Inpatient Hospital Stay (HOSPITAL_COMMUNITY): Payer: 59

## 2015-02-26 ENCOUNTER — Encounter (HOSPITAL_COMMUNITY): Payer: Self-pay | Admitting: Radiology

## 2015-02-26 DIAGNOSIS — I611 Nontraumatic intracerebral hemorrhage in hemisphere, cortical: Secondary | ICD-10-CM | POA: Insufficient documentation

## 2015-02-26 DIAGNOSIS — J9601 Acute respiratory failure with hypoxia: Secondary | ICD-10-CM

## 2015-02-26 DIAGNOSIS — I10 Essential (primary) hypertension: Secondary | ICD-10-CM

## 2015-02-26 DIAGNOSIS — J690 Pneumonitis due to inhalation of food and vomit: Secondary | ICD-10-CM

## 2015-02-26 DIAGNOSIS — R509 Fever, unspecified: Secondary | ICD-10-CM | POA: Insufficient documentation

## 2015-02-26 DIAGNOSIS — G936 Cerebral edema: Secondary | ICD-10-CM

## 2015-02-26 LAB — C DIFFICILE QUICK SCREEN W PCR REFLEX
C DIFFICILE (CDIFF) INTERP: NEGATIVE
C Diff antigen: NEGATIVE
C Diff toxin: NEGATIVE

## 2015-02-26 LAB — BASIC METABOLIC PANEL
BUN: 32 mg/dL — AB (ref 6–20)
CO2: 19 mmol/L — ABNORMAL LOW (ref 22–32)
CREATININE: 1.36 mg/dL — AB (ref 0.61–1.24)
Calcium: 7.2 mg/dL — ABNORMAL LOW (ref 8.9–10.3)
GFR calc Af Amer: >60 mL/min (ref >60)
GFR, EST NON AFRICAN AMERICAN: 53 mL/min — AB (ref >60)
Glucose, Bld: 198 mg/dL — ABNORMAL HIGH (ref 65–99)
POTASSIUM: 3 mmol/L — AB (ref 3.5–5.1)
SODIUM: 162 mmol/L — AB (ref 135–145)

## 2015-02-26 LAB — POCT I-STAT 3, ART BLOOD GAS (G3+)
ACID-BASE DEFICIT: 7 mmol/L — AB (ref 0.0–2.0)
Acid-base deficit: 8 mmol/L — ABNORMAL HIGH (ref 0.0–2.0)
BICARBONATE: 17 meq/L — AB (ref 20.0–24.0)
Bicarbonate: 18.3 mEq/L — ABNORMAL LOW (ref 20.0–24.0)
O2 SAT: 99 %
O2 Saturation: 92 %
PH ART: 7.428 (ref 7.350–7.450)
PH ART: 7.285 — AB (ref 7.350–7.450)
TCO2: 18 mmol/L (ref 0–100)
TCO2: 19 mmol/L (ref 0–100)
pCO2 arterial: 25.7 mmHg — ABNORMAL LOW (ref 35.0–45.0)
pCO2 arterial: 38.7 mmHg (ref 35.0–45.0)
pO2, Arterial: 60 mmHg — ABNORMAL LOW (ref 80.0–100.0)
pO2, Arterial: 183 mmHg — ABNORMAL HIGH (ref 80.0–100.0)

## 2015-02-26 LAB — URINALYSIS, ROUTINE W REFLEX MICROSCOPIC
Bilirubin Urine: NEGATIVE
GLUCOSE, UA: NEGATIVE mg/dL
Ketones, ur: 15 mg/dL — AB
LEUKOCYTES UA: NEGATIVE
Nitrite: NEGATIVE
PH: 5 (ref 5.0–8.0)
Protein, ur: 100 mg/dL — AB
Specific Gravity, Urine: 1.02 (ref 1.005–1.030)
Urobilinogen, UA: 1 mg/dL (ref 0.0–1.0)

## 2015-02-26 LAB — CBC
HCT: 23.7 % — ABNORMAL LOW (ref 39.0–52.0)
Hemoglobin: 7.5 g/dL — ABNORMAL LOW (ref 13.0–17.0)
MCH: 29.1 pg (ref 26.0–34.0)
MCHC: 31.6 g/dL (ref 30.0–36.0)
MCV: 91.9 fL (ref 78.0–100.0)
PLATELETS: 192 10*3/uL (ref 150–400)
RBC: 2.58 MIL/uL — AB (ref 4.22–5.81)
RDW: 14.5 % (ref 11.5–15.5)
WBC: 15.1 10*3/uL — AB (ref 4.0–10.5)

## 2015-02-26 LAB — GLUCOSE, CAPILLARY
GLUCOSE-CAPILLARY: 145 mg/dL — AB (ref 65–99)
GLUCOSE-CAPILLARY: 240 mg/dL — AB (ref 65–99)
Glucose-Capillary: 312 mg/dL — ABNORMAL HIGH (ref 65–99)

## 2015-02-26 LAB — SODIUM
SODIUM: 157 mmol/L — AB (ref 135–145)
SODIUM: 161 mmol/L — AB (ref 135–145)

## 2015-02-26 LAB — URINE MICROSCOPIC-ADD ON

## 2015-02-26 MED ORDER — LISINOPRIL 10 MG PO TABS
10.0000 mg | ORAL_TABLET | Freq: Two times a day (BID) | ORAL | Status: DC
  Administered 2015-02-26: 10 mg via ORAL
  Filled 2015-02-26: qty 1

## 2015-02-26 MED ORDER — MORPHINE BOLUS VIA INFUSION
5.0000 mg | INTRAVENOUS | Status: DC | PRN
  Filled 2015-02-26: qty 20

## 2015-02-26 MED ORDER — POTASSIUM CHLORIDE 10 MEQ/100ML IV SOLN
10.0000 meq | INTRAVENOUS | Status: AC
  Administered 2015-02-26 (×4): 10 meq via INTRAVENOUS
  Filled 2015-02-26: qty 100

## 2015-02-26 MED ORDER — FENTANYL CITRATE (PF) 100 MCG/2ML IJ SOLN
INTRAMUSCULAR | Status: AC
  Administered 2015-02-26: 100 ug
  Filled 2015-02-26: qty 2

## 2015-02-26 MED ORDER — FREE WATER
100.0000 mL | Status: DC
  Administered 2015-02-26: 100 mL

## 2015-02-26 MED ORDER — POTASSIUM PHOSPHATES 15 MMOLE/5ML IV SOLN
20.0000 mmol | Freq: Once | INTRAVENOUS | Status: AC
  Administered 2015-02-26: 20 mmol via INTRAVENOUS
  Filled 2015-02-26: qty 6.67

## 2015-02-26 MED ORDER — MORPHINE SULFATE 25 MG/ML IV SOLN
10.0000 mg/h | INTRAVENOUS | Status: DC
  Administered 2015-02-26: 5 mg/h via INTRAVENOUS
  Administered 2015-02-26: 10 mg/h via INTRAVENOUS
  Filled 2015-02-26 (×2): qty 10

## 2015-02-26 MED ORDER — MIDAZOLAM HCL 2 MG/2ML IJ SOLN
INTRAMUSCULAR | Status: AC
  Administered 2015-02-26: 2 mg
  Filled 2015-02-26: qty 2

## 2015-02-26 MED ORDER — PIPERACILLIN-TAZOBACTAM 3.375 G IVPB
3.3750 g | Freq: Three times a day (TID) | INTRAVENOUS | Status: DC
  Filled 2015-02-26 (×3): qty 50

## 2015-02-26 MED ORDER — FENTANYL CITRATE (PF) 100 MCG/2ML IJ SOLN
50.0000 ug | INTRAMUSCULAR | Status: DC | PRN
  Administered 2015-02-26: 50 ug via INTRAVENOUS
  Filled 2015-02-26: qty 2

## 2015-02-26 MED ORDER — VANCOMYCIN HCL 10 G IV SOLR
1250.0000 mg | Freq: Once | INTRAVENOUS | Status: AC
  Administered 2015-02-26: 1250 mg via INTRAVENOUS
  Filled 2015-02-26: qty 1250

## 2015-02-26 MED ORDER — ROCURONIUM BROMIDE 50 MG/5ML IV SOLN
50.0000 mg | Freq: Once | INTRAVENOUS | Status: AC
  Administered 2015-02-26: 50 mg via INTRAVENOUS
  Filled 2015-02-26: qty 5

## 2015-02-26 MED ORDER — THIAMINE HCL 100 MG/ML IJ SOLN
100.0000 mg | INTRAMUSCULAR | Status: DC
Start: 2015-02-26 — End: 2015-02-26
  Administered 2015-02-26: 100 mg via INTRAVENOUS
  Filled 2015-02-26: qty 2

## 2015-02-26 MED ORDER — ANTISEPTIC ORAL RINSE SOLUTION (CORINZ): 7.0000 mL | Freq: Four times a day (QID) | OROMUCOSAL | Status: DC

## 2015-02-26 MED ORDER — VANCOMYCIN HCL IN DEXTROSE 1-5 GM/200ML-% IV SOLN: 1000.0000 mg | INTRAVENOUS | Status: DC

## 2015-02-26 MED ORDER — FENTANYL CITRATE (PF) 100 MCG/2ML IJ SOLN: 50.0000 ug | INTRAMUSCULAR | Status: DC | PRN

## 2015-02-26 MED ORDER — MAGNESIUM SULFATE IN D5W 10-5 MG/ML-% IV SOLN
1.0000 g | Freq: Once | INTRAVENOUS | Status: AC
  Administered 2015-02-26: 1 g via INTRAVENOUS
  Filled 2015-02-26: qty 100

## 2015-02-26 MED ORDER — SODIUM CHLORIDE 0.9 % IV SOLN
1.0000 mg | Freq: Every day | INTRAVENOUS | Status: DC
  Administered 2015-02-26: 1 mg via INTRAVENOUS
  Filled 2015-02-26: qty 0.2

## 2015-02-26 MED ORDER — ETOMIDATE 2 MG/ML IV SOLN
20.0000 mg | Freq: Once | INTRAVENOUS | Status: AC
Start: 2015-02-26 — End: 2015-02-26
  Administered 2015-02-26: 20 mg via INTRAVENOUS

## 2015-02-26 MED ORDER — CHLORHEXIDINE GLUCONATE 0.12% ORAL RINSE (MEDLINE KIT): 15.0000 mL | Freq: Two times a day (BID) | OROMUCOSAL | Status: DC

## 2015-02-26 NOTE — Progress Notes (Addendum)
RT Note: Weaning of ventilator via the withdrawal of life-sustaining treatment protocol was initiated. Protocol order was entered and family is at bedside as well as patients RN, Tyron RussellYeni. Sedation has been started and the family are now ready to proceed with extubating the patient. Patient will be weaned per protocol and extubated per protocol as well. Patient appears very comfortable and is in no distress currentlty. Rt will continue to monitor.

## 2015-02-26 NOTE — Progress Notes (Signed)
Spoke with family extensively with assistance of translators (two MD's that are family friends and daughter and son that speak english) after discussion decision was made to make patient DNR and to begin morphine and extubate the patient terminally.  Will start morphine, extubate and place on 2L .  Alyson ReedyWesam G. Yacoub, M.D. [facility]. Pager: [phone]. After hours pager: [phone].

## 2015-02-26 NOTE — Progress Notes (Signed)
CRITICAL VALUE ALERT  Critical value received: Na 161  Date of notification: 11/12  Time of notification:  0030   Critical value read back:Yes.    Nurse who received alert: Juleen Starraryn Dmani Mizer  MD notified (1st page):    Time of first page:    MD notified (2nd page):  Sodium level same as previously reported. Free water and NS have been started.  Time of second page:  Responding MD:    Time MD responded:

## 2015-02-26 NOTE — Procedures (Signed)
Extubation Procedure Note  Patient Details:   Name: Keith Bradshaw DOB: 1950/07/01 MRN: [id number]      Evaluation O2 sats: transiently fell during during procedure Complications: No apparent complications Patient did tolerate procedure well. Bilateral Breath Sounds: Clear    RT Note: Patient was weaned off of the ventilator per the withdrawal of life sustaining treatment protocol. He was started on sedation and once he was comfortable and the family was ready, weaning was initiated. He was gradually weaned off of the ventilator and remained stable and comfortable throughout the entire process. RN, Tyron RussellYeni as well as a member of the family was at the bedside for the extubation and Tyron RussellYeni was also at bedside throughout the weaning process to ensure that the patient remained stable and comfortable. Patient was extubated to RA per protocol but then [facility] 2lpm via nasal cannula. Breath sounds are clear and no stridor or acute distress was noted. RT will continue to monitor and assist as needed.    Idell PicklesDana R Mahmood Boehringer 02/26/2015, 1704

## 2015-02-26 NOTE — Progress Notes (Signed)
ANTIBIOTIC CONSULT NOTE - INITIAL  Pharmacy Consult for vanc/zosyn Indication: pneumonia  No Known Allergies  Patient Measurements: Height:  (167.6 cm) Weight: 135 lb 12.9 oz (61.6 kg) IBW/kg (Calculated) : 63.8 Adjusted Body Weight:   Vital Signs: Temp: 101.4 F (38.6 C) (11/12 0758) Temp Source: Axillary (11/12 0758) BP: 129/73 mmHg (11/12 1100) Pulse Rate: 86 (11/12 1100) Intake/Output from previous day: 11/11 0701 - 11/12 0700 In: 3936.5 [I.V.:2282.5; NG/GT:940; IV Piggyback:714] Out: 1700 [Urine:1700] Intake/Output from this shift: Total I/O In: 1653.1 [I.V.:431.3; NG/GT:260; IV Piggyback:961.9] Out: 301 [Urine:300; Stool:1]  Labs:  Recent Labs  02/24/15 0615 02/25/15 0535 02/25/15 1822 02/26/15 0555  WBC 12.7* 17.1*  --  15.1*  HGB 7.8* 8.0*  --  7.5*  PLT 206 214  --  192  CREATININE 1.19 1.19 1.23 1.36*   Estimated Creatinine Clearance: 47.2 mL/min (by C-G formula based on Cr of 1.36). No results for input(s): VANCOTROUGH, VANCOPEAK, VANCORANDOM, GENTTROUGH, GENTPEAK, GENTRANDOM, TOBRATROUGH, TOBRAPEAK, TOBRARND, AMIKACINPEAK, AMIKACINTROU, AMIKACIN in the last 72 hours.   Microbiology: Recent Results (from the past 720 hour(s))  MRSA PCR Screening     Status: None   Collection Time: 03/10/15  6:07 PM  Result Value Ref Range Status   MRSA by PCR NEGATIVE NEGATIVE Final    Comment:        The GeneXpert MRSA Assay (FDA approved for NASAL specimens only), is one component of a comprehensive MRSA colonization surveillance program. It is not intended to diagnose MRSA infection nor to guide or monitor treatment for MRSA infections.     Medical History: Past Medical History  Diagnosis Date  . Hypertension   . Diabetes (HCC)   . Subarachnoid hemorrhage [facility]) March 2015  . Dysphagia March 2015    secondary to [facility]  . Seizures (HCC)     takes keppra    Medications:  Scheduled:  . fentaNYL      . midazolam      .  stroke: mapping our  early stages of recovery book   Does not apply Once  . antiseptic oral rinse  7 mL Mouth Rinse q12n4p  . chlorhexidine  15 mL Mouth Rinse BID  . feeding supplement (PRO-STAT SUGAR FREE 64)  30 mL Per Tube Daily  . folic acid (FOLVITE) IVPB  1 mg Intravenous Daily  . free water  100 mL Per Tube Q4H  . insulin aspart  0-9 Units Subcutaneous 6 times per day  . levETIRAcetam  500 mg Intravenous Q12H  . lisinopril  10 mg Oral BID  . metoprolol tartrate  50 mg Oral BID  . pantoprazole (PROTONIX) IV  40 mg Intravenous QHS  . piperacillin-tazobactam (ZOSYN)  IV  3.375 g Intravenous Q8H  . pneumococcal 23 valent vaccine  0.5 mL Intramuscular Tomorrow-1000  . potassium chloride  10 mEq Intravenous Q1 Hr x 5  . potassium phosphate IVPB (mmol)  20 mmol Intravenous Once  . senna-docusate  1 tablet Oral BID  . thiamine IV  100 mg Intravenous Q24H  . vancomycin  1,250 mg Intravenous Once  . [START ON 03/05/2015] vancomycin  1,000 mg Intravenous Q24H   Infusions:  . sodium chloride 75 mL/hr at 02/25/15 0906  . feeding supplement (JEVITY 1.2 CAL) 1,000 mL (02/25/15 1410)  . niCARDipine Stopped (02/26/15 0955)  . sodium chloride (hypertonic) 30 mL/hr at 02/24/15 1400   Assessment: 64 yo who was admitted for left frontopareital hemorrhage. He is now being seen by CCM. Vanc/zosyn have been ordered  to r/o PNA. He is febrile today but that could also be due to hemorrhage. His scr has trended up slightly so we'll try to keep it at q24 for now.   Goal of Therapy:  Vancomycin trough level 15-20 mcg/ml  Plan:   Vanc 1.25g IV x1 then 1g IV q24 Zosyn 3.375g IV q8 extended infusion F/u with level as needed  Ulyses SouthwardMinh Pham, PharmD Pager: [phone] 02/26/2015 12:07 PM

## 2015-02-26 NOTE — Procedures (Signed)
Intubation Procedure Note Azlan DhaAshok Croonkal [id number] 08/27/1950  Procedure: Intubation Indications: Airway protection and maintenance  Procedure Details Consent: Risks of procedure as well as the alternatives and risks of each were explained to the (patient/caregiver).  Consent for procedure obtained. Time Out: Verified patient identification, verified procedure, site/side was marked, verified correct patient position, special equipment/implants available, medications/allergies/relevent history reviewed, required imaging and test results available.  Performed  Maximum sterile technique was used including gloves, hand hygiene and mask.  MAC    Evaluation Hemodynamic Status: BP stable throughout; O2 sats: stable throughout Patient's Current Condition: stable Complications: No apparent complications Patient did tolerate procedure well. Chest X-ray ordered to verify placement.  CXR: pending.   Everly Rubalcava 02/26/2015

## 2015-02-26 NOTE — Progress Notes (Signed)
Paged Dr. Roseanne RenoStewart regarding patient's respiratory status and lethargy. Orders for ABG and non-rebreather. Will monitor.

## 2015-02-26 NOTE — Progress Notes (Signed)
SLP Cancellation Note  Patient Details Name: [name] MRN: [id number] DOB: 1951/04/07   Cancelled treatment:       Reason Eval/Treat Not Completed: Fatigue/lethargy limiting ability to participate;Medical issues which prohibited therapy   Katey Barrie,PAT, M.S., CCC-SLP 02/26/2015, 10:35 AM

## 2015-02-26 NOTE — Consult Note (Signed)
PULMONARY / CRITICAL CARE MEDICINE   Name: Keith Bradshaw MRN: [id number] DOB: Mar 24, 1951    ADMISSION DATE:  02/28/2015 CONSULTATION DATE:  11/12  REFERRING MD :  Roda Shutters  CHIEF COMPLAINT:  Worsening respiratory failure   INITIAL PRESENTATION:  42 yom admitted 11/8 w/ large left parietal ICH. Treated supportively w/ 3% NS, anticonvulsants and nutritional care. Developed fever, decreased LOC and episodes of hypoxia w/ sats <88% on 4 liters. PCCM asked to eval.   STUDIES:  11/12 CT head: 1. Large left parietal lobe hematoma is relatively stable in size, measuring approximately 7.1 x 4.5 cm, with surrounding edema. Degree of rightward midline shift is relatively stable, measuring 4 mm. Trace intraventricular blood again noted, stable in appearance. 2. Underlying mild cortical volume loss and scattered small vessel ischemic microangiopathy.  SIGNIFICANT EVENTS: 11/8 admitted 11/12 PCCM asked to see. Pt w/ new HCAP vs aspiration and worsening resp failure. MS decreased. Required intubation    HISTORY OF PRESENT ILLNESS:   64 y/o M w/ PMHx of HTN, DM type II, h/o seizures and subarachnoid hemorrhage in 06/2013, brought to ED by EMS on 11/8 for right hemiplegia, aphasia, and left gaze preference. No report of recent head trauma, use of anticoagulants, or antiplatelets.He remained with SBP of 190/110 in the ED, was given 10 mg IV labetalol and started IV nicardipine.CT head in ED showed a large left frontoparietal intracerebral hemorrhage measuring up to 6 cm. 4 mm of left-to-right midline shift. No hydrocephalus or IV extension. Seen by neurosurgery, patient not considered a surgical candidate at this time.  Admitted to stroke service. Treated supportively w/ 3% NS, BP control and nutritional support. Developed fever, worsening hypoxia and decreased LOC on 11/12 so PCCM asked to a/w care.   PAST MEDICAL HISTORY :   has a past medical history of Hypertension; Diabetes (HCC); Subarachnoid hemorrhage  [facility]) (March 2015); Dysphagia (March 2015); and Seizures [facility]).  has past surgical history that includes No past surgeries. Prior to Admission medications   Medication Sig Start Date End Date Taking? Authorizing Provider  carvedilol (COREG) 6.25 MG tablet Take 6.25 mg by mouth 2 (two) times daily. 02/20/15  Yes Historical Provider, MD  citalopram (CELEXA) 20 MG tablet Take 20 mg by mouth daily. 02/20/15  Yes Historical Provider, MD  gabapentin (NEURONTIN) 100 MG capsule  01/28/15  Yes Historical Provider, MD  levETIRAcetam (KEPPRA) 250 MG tablet Take 250 mg by mouth 2 (two) times daily.  12/02/14  Yes Historical Provider, MD  lisinopril (PRINIVIL,ZESTRIL) 10 MG tablet Take 10 mg by mouth daily.   Yes Historical Provider, MD  lovastatin (MEVACOR) 20 MG tablet Take 20 mg by mouth daily. 02/20/15  Yes Historical Provider, MD  metFORMIN (GLUCOPHAGE) 1000 MG tablet Take 1,000 mg by mouth 2 (two) times daily. 02/20/15  Yes Historical Provider, MD   No Known Allergies  FAMILY HISTORY:  has no family status information on file.  SOCIAL HISTORY:  reports that he quit smoking about 1 years ago. He does not have any smokeless tobacco history on file. He reports that he drinks alcohol.  REVIEW OF SYSTEMS:  Unable   SUBJECTIVE: unresponsive   VITAL SIGNS: Temp:  [98.1 F (36.7 C)-101.4 F (38.6 C)] 101.4 F (38.6 C) (11/12 0758) Pulse Rate:  [82-103] 86 (11/12 1100) Resp:  [18-33] 27 (11/12 1100) BP: (97-155)/(63-103) 129/73 mmHg (11/12 1100) SpO2:  [87 %-100 %] 95 % (11/12 1100) FiO2 (%):  [45 %-100 %] 100 % (11/12 0300) Weight:  [61.6 kg (  135 lb 12.9 oz)] 61.6 kg (135 lb 12.9 oz) (11/12 0408) HEMODYNAMICS:   VENTILATOR SETTINGS: Vent Mode:  [-]  FiO2 (%):  [45 %-100 %] 100 % INTAKE / OUTPUT:  Intake/Output Summary (Last 24 hours) at 02/26/15 1146 Last data filed at 02/26/15 1100  Gross per 24 hour  Intake 4984.62 ml  Output   1926 ml  Net 3058.62 ml    PHYSICAL EXAMINATION: General:   Essentially unresponsive. Does not f/c. Weak cough, not protecting his airway Neuro:  Lethargic, w/d to pain. No f/c.  HEENT:  NCAT, MM dry, feeding tube in place  Cardiovascular:  Rrr; no MRG Lungs:  Scattered rhonchi,  Abdomen:  Soft, no OM, positive Bowel sounds  Musculoskeletal:  Good bulk, intact  Skin:  Dry and intact, warm to touch   LABS:  CBC  Recent Labs Lab 02/24/15 0615 02/25/15 0535 02/26/15 0555  WBC 12.7* 17.1* 15.1*  HGB 7.8* 8.0* 7.5*  HCT 24.5* 25.7* 23.7*  PLT 206 214 192   Coag's  Recent Labs Lab 02/25/2015 1437  APTT 34  INR 1.35   BMET  Recent Labs Lab 02/25/15 0535  02/25/15 1822 02/26/15 0005 02/26/15 0555  NA 162*  < > 161* 161* 162*  K 2.8*  --  3.3*  --  3.0*  CL >130*  --  >130*  --  >130*  CO2 22  --  21*  --  19*  BUN 24*  --  33*  --  32*  CREATININE 1.19  --  1.23  --  1.36*  GLUCOSE 172*  --  228*  --  198*  < > = values in this interval not displayed. Electrolytes  Recent Labs Lab 02/25/15 0535 02/25/15 1822 02/26/15 0555  CALCIUM 8.1* 7.5* 7.2*  PHOS 1.8*  --   --    Sepsis Markers No results for input(s): LATICACIDVEN, PROCALCITON, O2SATVEN in the last 168 hours. ABG  Recent Labs Lab 02/26/15 0314  PHART 7.428  PCO2ART 25.7*  PO2ART 60.0*   Liver Enzymes  Recent Labs Lab 02/15/2015 1437  AST 24  ALT 14*  ALKPHOS 47  BILITOT 1.0  ALBUMIN 3.9   Cardiac Enzymes No results for input(s): TROPONINI, PROBNP in the last 168 hours. Glucose  Recent Labs Lab 02/25/15 1221 02/25/15 1532 02/25/15 2044 02/25/15 2339 02/26/15 0332 02/26/15 0752  GLUCAP 153* 138* 252* 243* 145* 240*    Imaging Ct Head Wo Contrast  02/26/2015  CLINICAL DATA:  Acute altered mental status.  Subsequent encounter. EXAM: CT HEAD WITHOUT CONTRAST TECHNIQUE: Contiguous axial images were obtained from the base of the skull through the vertex without intravenous contrast. COMPARISON:  CT of the head performed 02/23/2015 FINDINGS:  The known large left parietal lobe hematoma is relatively stable in size, measuring approximately 7.1 x 4.5 cm, with surrounding edema. The degree of rightward midline shift measures 4 mm, stable from the prior study. Trace blood within the occipital horns of the lateral ventricles is relatively stable in appearance. Underlying mild cortical volume loss is noted. Cerebellar atrophy is seen. Scattered periventricular white matter change likely reflects small vessel ischemic microangiopathy. The brainstem and fourth ventricle are within normal limits. There is no evidence of fracture; visualized osseous structures are unremarkable in appearance. The orbits are within normal limits. The paranasal sinuses and mastoid air cells are well-aerated. No significant soft tissue abnormalities are seen. IMPRESSION: 1. Large left parietal lobe hematoma is relatively stable in size, measuring approximately 7.1 x 4.5 cm, with  surrounding edema. Degree of rightward midline shift is relatively stable, measuring 4 mm. Trace intraventricular blood again noted, stable in appearance. 2. Underlying mild cortical volume loss and scattered small vessel ischemic microangiopathy. Electronically Signed   By: Roanna Raider M.D.   On: 02/26/2015 04:04   Dg Chest Port 1 View  02/26/2015  ADDENDUM REPORT: 02/26/2015 09:58 ADDENDUM: Critical Value/emergent results were discussed with the patient's provider CHERE GREGORY on 02/26/2015 at time 9:58 am. Electronically Signed   By: Norva Pavlov M.D.   On: 02/26/2015 09:58  02/26/2015  CLINICAL DATA:  Fever EXAM: PORTABLE CHEST 1 VIEW COMPARISON:  02/25/2015 FINDINGS: Feeding tube is in place, tip overlying the level of the ligament of Treitz. Right IJ central line tip directed to the right likely in the right subclavian vein. Heart size is normal. There are bibasilar opacities obscuring the hemidiaphragm. Findings are consistent with bilateral lower lobe consolidation and bilateral pleural  effusions. No pulmonary edema. IMPRESSION: 1. Interval development of bilateral lower lobe infiltrates and bilateral pleural effusions. 2. Unchanged appearance of right IJ central line. Its tip likely within the right subclavian vein. Critical Value/emergent results will be telephoned to the patient's provider CHERE GREGORY at the time of interpretation on date 02/26/2015 at time 9:28 am. Electronically Signed: By: Norva Pavlov M.D. On: 02/26/2015 09:46  worsening bibasilar airspace disease    ASSESSMENT / PLAN:  PULMONARY OETT 11/12>>> A: Acute hypoxic respiratory failure  Inability to protect airway  HCAP vs aspiration  P:   Intubate/ventilate-->anticipate he will need Trach F/u cxr F/u abg Wean as tolerated   CARDIOVASCULAR CVL Right IJ CVL  A:  Sepsis  P:  Cont IVFs Tele  See ID section   RENAL A:   Hypernatremia (was on 3%) Hyperchloremic metabolic acidosis Hypokalemia  AKI  P:   Goal Na 150-155; held 3% Free water added by neuro  Replace K  F/u chemistry   GASTROINTESTINAL A:   Protein cal malnutrition   P:   Tube feeds  PPI   HEMATOLOGIC A:   Anemia of chronic disease  P:  Transfuse per ICU protocol SCDs  INFECTIOUS A:   HCAP vs aspiration PNA  P:   Sputum 11/12>>> vanc 11/12>>> Zosyn 11/12>>>  ENDOCRINE A:   DM w/ hyperglycemia  P:   ssi protocol   NEUROLOGIC A:   Left Frontoparietal Parenchymal Hematoma with Subarachnoid Extension:  Resultant right hemiplegia, aphasia, and left gaze preference Acute encephalopathy ? Exacerbated by fever  P:   RASS goal: -1 PAD protocol  Hypertonic NS and anticonvulsants per neuro  FAMILY  - Updates:   - Inter-disciplinary family meet or Palliative Care meeting due by:  11/19    TODAY'S SUMMARY:  Admitted w/ Left ICH. Worse MS, fever and hypoxia. Suspect that this is either aspiration or HCAP. Suspect fever worsening his exam. Needs intubation for airway protection. Started ABX. Will  likely need trach.  Simonne Martinet ACNP-BC [facility] Pulmonary/Critical Care Pager # [phone] OR # [phone] if no answer  Attending Note:  64 year old male with ICH who is developing deterioration in mental status and subsequent aspiration pneumonia and hypoxemic respiratory failure. Patient is altered on exam with diffuse crackles. Will intubate, mechanically ventilate, increase PEEP for likely developing ARDS, pan culture, begin vanc/zosyn and maintain until mental status is addressed. Spoke with family they wish him to be full code. Will also order a follow up CXR and ABG. Replace K.  I reviewed CXR myself, diffuse  infiltrate.  The patient is critically ill with multiple organ systems failure and requires high complexity decision making for assessment and support, frequent evaluation and titration of therapies, application of advanced monitoring technologies and extensive interpretation of multiple databases.   Critical Care Time devoted to patient care services described in this note is 45 Minutes. This time reflects time of care of this signee Dr Koren BoundWesam Yacoub. This critical care time does not reflect procedure time, or teaching time or supervisory time of PA/NP/Med student/Med Resident etc but could involve care discussion time.  Alyson ReedyWesam G. Yacoub, M.D. [facility] Pulmonary/Critical Care Medicine. Pager: [phone]. After hours pager: [phone].   02/26/2015, 11:46 AM

## 2015-02-26 NOTE — Progress Notes (Signed)
MD notified that patient's O2 sats dropped to 88% on 4L [place].  Non-rebreather placed on patient and O2 sats went up to no more than 93%.  MD will come and see patient.  No order received at this time.  Will continue to monitor patient.

## 2015-02-26 NOTE — Progress Notes (Signed)
STROKE TEAM PROGRESS NOTE   HISTORY 64 y/o M w/ PMHx of HTN, DM type II, h/o seizures and subarachnoid hemorrhage in 06/2013, brought to ED by EMS for right hemiplegia, aphasia, and left gaze preference. Per chart review, patient was at home, took a nap yesterday morning around 10 AM. At 1 PM, wife could not wake him from sleep at which time EMS was called. BP documented by EMS was 180/120. No report of recent head trauma, use of anticoagulants, or antiplatelets.He remained with SBP of 190/110 in the ED, was given 10 mg IV labetalol and started IV nicardipine.CT head in ED showed a large left frontoparietal intracerebral hemorrhage measuring up to 6 cm. 4 mm of left-to-right midline shift. No hydrocephalus or IV extension. Seen by neurosurgery, patient not considered a surgical candidate at this time.    He was admitted to the neuro ICU for further evaluation and treatment.   SUBJECTIVE (INTERVAL HISTORY) No significant change in neuro exam. However, patient had an hypoxic episode.  Initially, patient went from room air to requiring 2L [place], then 4L [place] and ultimately non-rebreather.  Patient currently on 6L [place].  Patient spiked a temperature this morning.  Na ~162 this morning.     OBJECTIVE Temp:  [98.1 F (36.7 C)-101.4 F (38.6 C)] 101.4 F (38.6 C) (11/12 0758) Pulse Rate:  [82-103] 99 (11/12 0700) Cardiac Rhythm:  [-] Normal sinus rhythm (11/11 2000) Resp:  [17-32] 24 (11/12 0700) BP: (97-155)/(63-103) 130/70 mmHg (11/12 0700) SpO2:  [87 %-100 %] 96 % (11/12 0700) FiO2 (%):  [45 %-100 %] 100 % (11/12 0300) Weight:  [61.6 kg (135 lb 12.9 oz)] 61.6 kg (135 lb 12.9 oz) (11/12 0408)  CBC:  Recent Labs Lab Mar 04, 2015 1437  02/25/15 0535 02/26/15 0555  WBC 8.1  < > 17.1* 15.1*  NEUTROABS 4.5  --   --   --   HGB 9.7*  < > 8.0* 7.5*  HCT 30.2*  < > 25.7* 23.7*  MCV 90.1  < > 92.1 91.9  PLT 221  < > 214 192  < > = values in this interval not displayed.  Basic Metabolic Panel:  Recent  Labs Lab 02/25/15 0535  02/25/15 1822 02/26/15 0005 02/26/15 0555  NA 162*  < > 161* 161* 162*  K 2.8*  --  3.3*  --  3.0*  CL >130*  --  >130*  --  >130*  CO2 22  --  21*  --  19*  GLUCOSE 172*  --  228*  --  198*  BUN 24*  --  33*  --  32*  CREATININE 1.19  --  1.23  --  1.36*  CALCIUM 8.1*  --  7.5*  --  7.2*  PHOS 1.8*  --   --   --   --   < > = values in this interval not displayed.   IMAGING  Ct Head Wo Contrast  02/26/2015  CLINICAL DATA:  Acute altered mental status.  Subsequent encounter. EXAM: CT HEAD WITHOUT CONTRAST TECHNIQUE: Contiguous axial images were obtained from the base of the skull through the vertex without intravenous contrast. COMPARISON:  CT of the head performed 02/23/2015 FINDINGS: The known large left parietal lobe hematoma is relatively stable in size, measuring approximately 7.1 x 4.5 cm, with surrounding edema. The degree of rightward midline shift measures 4 mm, stable from the prior study. Trace blood within the occipital horns of the lateral ventricles is relatively stable in appearance. Underlying mild  cortical volume loss is noted. Cerebellar atrophy is seen. Scattered periventricular white matter change likely reflects small vessel ischemic microangiopathy. The brainstem and fourth ventricle are within normal limits. There is no evidence of fracture; visualized osseous structures are unremarkable in appearance. The orbits are within normal limits. The paranasal sinuses and mastoid air cells are well-aerated. No significant soft tissue abnormalities are seen. IMPRESSION: 1. Large left parietal lobe hematoma is relatively stable in size, measuring approximately 7.1 x 4.5 cm, with surrounding edema. Degree of rightward midline shift is relatively stable, measuring 4 mm. Trace intraventricular blood again noted, stable in appearance. 2. Underlying mild cortical volume loss and scattered small vessel ischemic microangiopathy. Electronically Signed   By: Roanna RaiderJeffery   Chang M.D.   On: 02/26/2015 04:04   Dg Chest Port 1 View  02/25/2015  CLINICAL DATA:  Aspiration into airway EXAM: PORTABLE CHEST 1 VIEW COMPARISON:  Yesterday FINDINGS: Laterally directed right neck catheter has unchanged malpositioned appearance, with tip likely in the right subclavian vein. Interval removal of a feeding tube. There is no edema, consolidation, effusion, or pneumothorax. Normal heart size and stable mediastinal contours. IMPRESSION: Clear lungs.  No evidence of aspiration pneumonitis. Electronically Signed   By: Marnee SpringJonathon  Watts M.D.   On: 02/25/2015 03:20   Dg Chest Port 1 View  02/24/2015  CLINICAL DATA:  Altered mental status, subarachnoid hemorrhage, seizures EXAM: PORTABLE CHEST 1 VIEW COMPARISON:  January 13, 2015 FINDINGS: Lungs are clear.  No pleural effusion or pneumothorax. The heart is normal in size. Stable right IJ venous catheter terminating in the right subclavian vein. Enteric tube courses into the stomach. IMPRESSION: No evidence of acute cardiopulmonary disease. Stable right IJ venous catheter terminating in the right subclavian vein. Enteric tube courses into the stomach. Electronically Signed   By: Charline BillsSriyesh  Krishnan M.D.   On: 02/24/2015 11:31   Dg Abd Portable 1v  02/25/2015  CLINICAL DATA:  Feeding tube placement. EXAM: PORTABLE ABDOMEN - 1 VIEW COMPARISON:  02/23/2015 FINDINGS: Weighted tip enteric tube has mildly advanced in the interim and projects over the proximal third portion of the duodenum. Gas is present in nondilated loops of small and large bowel throughout the abdomen without evidence of obstruction. The visualized lung bases are grossly clear. No acute osseous abnormality is identified. IMPRESSION: Interval advancement of feeding tube into the third portion of the duodenum. Electronically Signed   By: Sebastian AcheAllen  Grady M.D.   On: 02/25/2015 11:25    PHYSICAL EXAM  General: Does not open eyes, in NAD. Coughing intermittently.  HEENT: PERRL, EOMI. Moist mucus  membranes. NGT in place.  Neck: Full range of motion without pain, supple, no lymphadenopathy or carotid bruits Lungs: Clear to ascultation bilaterally, normal work of respiration, no wheezes, rales, rhonchi Heart: RRR, no murmurs, gallops, or rubs Abdomen: Soft, non-tender, non-distended, BS + Extremities: No cyanosis, clubbing, or edema  Mental Status -  Decreased level of arousal, does not follow commands. . By report, language severely impaired; patient does not speak English or interact with familty Attention span and concentration severely impaired. Recent and remote memory unable to assess. Fund of Knowledge unable to assess.  Cranial Nerves II - XII-  PERRLA OCRs intact Right corneal weak.  Left corneal intact Spontaneous cough Exam limited by level of LOC    Motor/Sensory Strength -  RUE w/ rigidity/spasticity (chronic?). Does exhibit flexion to noxious stimuli RLE significant triple flexion  LUE w/ spontaneous movement and withdrawal to noxious stimuli LLE minimal triple flexion.   Coordination -  Deferred.  Gait and Station - Deferred.   ASSESSMENT/PLAN 64 y/o M w/ PMHx of HTN, DM type II, h/o seizures and subarachnoid hemorrhage in 06/2013, admitted for large left frontoparietal hemorrhage.   Left Frontoparietal Parenchymal Hematoma with Subarachnoid Extension:    Resultant right hemiplegia, aphasia, and left gaze preference  CT Head: Left frontoparietal parenchymal hematoma measuring 6.9 x 4.7 cm. Similar localized  vasogenic edema with persistent 4 mm of left-to-right shift. New intraventricular extension with blood  layering in the occipital horns of both lateral ventricles. Dilation of the temporal horns suspicious for  developing hydrocephalus. Also significant for acute subarachnoid hemorrhage within the left sylvian  fissure  2D Echo  EF 60-65%. Moderate thickening of noncoronary cusp of aortic valve. Cannot rule out  vegetation.  LDL Pending  HgbA1c  6.8  SCD's for VTE prophylaxis Diet NPO time specified  No antithrombotic prior to admission, now on No antithrombotic given ICH  NOT a surgical candidate based on location.   Ongoing aggressive stroke risk factor management  Hold 3% saline given Na 161. Goal 150-155.   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 Has been on Keppra 250 mg bid a  Continue Keppra 500 mg IV bid for now for seizure prophylaxis.   Cerebral Edema  Off of 3% for two days  Patient is hypernatremic and hyperchloremic.  Will need to introduce free water slowly  CARDIAC Hypertension  Tight BP control given ICH, continue Cardene gtt, try to wean off.   Continue Metoprolol to 50 mg bid  Will add ACE-I  Hyperlipidemia  Home meds:  Mevacor 20 mg held given NPO status; will not restart given large ICH and potential risk for worsening with statin  LDL pending, goal < 70  PULMONARY  CXR ordered now given spike in temp  Will monitor  GI  Tube fees  Last BM overnight.  Two loose stool  GU  No acute issues  HEMATOLOGY  Anemia stable overnight  ELECTROLYTES:  Hypokalemia; will replete  Hypernatremic.  Will carefully increase free water  Hyperphosphatemia, will replete  Will follow magnesium as well  Recheck in AM  Diabetes  Mild hyperglycemia; continue ISS q4h coverage  HgbA1c 6.8, goal < 7.0  Blood sugars 172-228  Adjust glucose protocol if remains elevated  Other Stroke Risk Factors  Hx stroke/TIA  Coronary artery disease   Hospital day # 4   No significant change in neuro status. However, hypoxic event this AM.  Na high at 162 this AM, 3% saline off for two days.     Personally examined patient and images, and have participated in and made any corrections needed to history, physical, neuro exam,assessment and plan as stated above.  I have personally obtained the  history, evaluated lab date, reviewed imaging studies and agree with radiology interpretations.   This patient is critically ill due to large left ICH, with IVH, baseline dysphagia, right hemiparesis and at significant risk of neurological worsening, death form hematoma enlargement, hydrocephalus, respiratory failure. This patient's care requires constant monitoring of vital signs, hemodynamics, respiratory and cardiac monitoring, review of multiple databases, neurological assessment, discussion with family, other specialists and medical decision making of high complexity. I spent 35 minutes of neurocritical care time in the care of this patient.  Sula Soda, MD Stroke Neurology [facility]   To contact Stroke Continuity provider, please refer to [email or web address]. After hours, contact  General Neurology

## 2015-02-27 DIAGNOSIS — T17998D Other foreign object in respiratory tract, part unspecified causing other injury, subsequent encounter: Secondary | ICD-10-CM

## 2015-02-27 DIAGNOSIS — T17908A Unspecified foreign body in respiratory tract, part unspecified causing other injury, initial encounter: Secondary | ICD-10-CM | POA: Insufficient documentation

## 2015-02-28 LAB — CULTURE, RESPIRATORY W GRAM STAIN

## 2015-02-28 LAB — CULTURE, RESPIRATORY: SPECIAL REQUESTS: NORMAL

## 2015-03-03 LAB — CULTURE, BLOOD (ROUTINE X 2)
CULTURE: NO GROWTH
CULTURE: NO GROWTH

## 2015-03-17 NOTE — Discharge Summary (Signed)
Stroke Discharge Summary  Patient ID: Keith Bradshaw   MRN: [id number]      DOB: December 20, 1950  Date of Admission: 03/02/2015 Date of Discharge: 03/04/15  Attending Physician:  Marvel Plan, MD, Stroke MD  Consulting Physician(s):     pulmonary/intensive care and neurosurgery  Patient's PCP:  No primary care provider on file.  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  Past Medical History  Diagnosis Date  . Hypertension   . Diabetes (HCC)   . Subarachnoid hemorrhage [facility]) March 2015  . Dysphagia March 2015    secondary to [facility]  . Seizures (HCC)     takes keppra   Past Surgical History  Procedure Laterality Date  . No past surgeries        Medication List    ASK your doctor about these medications        carvedilol 6.25 MG tablet  Commonly known as:  COREG  Take 6.25 mg by mouth 2 (two) times daily.     citalopram 20 MG tablet  Commonly known as:  CELEXA  Take 20 mg by mouth daily.     gabapentin 100 MG capsule  Commonly known as:  NEURONTIN     levETIRAcetam 250 MG tablet  Commonly known as:  KEPPRA  Take 250 mg by mouth 2 (two) times daily.     lisinopril 10 MG tablet  Commonly known as:  PRINIVIL,ZESTRIL  Take 10 mg by mouth daily.     lovastatin 20 MG tablet  Commonly known as:  MEVACOR  Take 20 mg by mouth daily.     metFORMIN 1000 MG tablet  Commonly known as:  GLUCOPHAGE  Take 1,000 mg by mouth 2 (two) times daily.        LABORATORY STUDIES CBC    Component Value Date/Time   WBC 15.1* 02/26/2015 0555   RBC 2.58*  02/26/2015 0555   HGB 7.5* 02/26/2015 0555   HCT 23.7* 02/26/2015 0555   PLT 192 02/26/2015 0555   MCV 91.9 02/26/2015 0555   MCH 29.1 02/26/2015 0555   MCHC 31.6 02/26/2015 0555   RDW 14.5 02/26/2015 0555   LYMPHSABS 2.6 03/02/2015 1437   MONOABS 0.9 03/16/2015 1437   EOSABS 0.1 02/21/2015 1437   BASOSABS 0.1 03/05/2015 1437   CMP    Component Value Date/Time   NA 157* 02/26/2015 1253   K 3.0* 02/26/2015 0555   CL >130* 02/26/2015 0555   CO2 19* 02/26/2015 0555   GLUCOSE 198* 02/26/2015 0555   BUN 32* 02/26/2015 0555   CREATININE 1.36* 02/26/2015 0555   CALCIUM 7.2* 02/26/2015 0555   PROT 6.7 03/01/2015 1437   ALBUMIN 3.9 03/11/2015 1437   AST 24 02/23/2015 1437   ALT 14* 03/14/2015 1437   ALKPHOS 47 03/12/2015 1437   BILITOT 1.0 02/15/2015 1437   GFRNONAA 53* 02/26/2015 0555   GFRAA >60 02/26/2015 0555   COAGS Lab Results  Component Value Date   INR 1.35 02/20/2015   Lipid PanelNo results found for: CHOL, TRIG, HDL, CHOLHDL, VLDL, LDLCALC HgbA1C  Lab Results  Component Value Date  HGBA1C 6.8* 02/23/2015   Cardiac Panel (last 3 results) No results for input(s): CKTOTAL, CKMB, TROPONINI, RELINDX in the last 72 hours. Urinalysis    Component Value Date/Time   COLORURINE YELLOW 02/26/2015 0958   APPEARANCEUR CLOUDY* 02/26/2015 0958   LABSPEC 1.020 02/26/2015 0958   PHURINE 5.0 02/26/2015 0958   GLUCOSEU NEGATIVE 02/26/2015 0958   HGBUR MODERATE* 02/26/2015 0958   BILIRUBINUR NEGATIVE 02/26/2015 0958   KETONESUR 15* 02/26/2015 0958   PROTEINUR 100* 02/26/2015 0958   UROBILINOGEN 1.0 02/26/2015 0958   NITRITE NEGATIVE 02/26/2015 0958   LEUKOCYTESUR NEGATIVE 02/26/2015 0958   Urine Drug Screen No results found for: LABOPIA, COCAINSCRNUR, LABBENZ, AMPHETMU, THCU, LABBARB  Alcohol Level No results found for: [facility]   SIGNIFICANT DIAGNOSTIC STUDIES   Ct Head Wo Contrast  02/26/2015 CLINICAL DATA: Acute altered mental status. Subsequent encounter. EXAM:  CT HEAD WITHOUT CONTRAST TECHNIQUE: Contiguous axial images were obtained from the base of the skull through the vertex without intravenous contrast. COMPARISON: CT of the head performed 02/23/2015 FINDINGS: The known large left parietal lobe hematoma is relatively stable in size, measuring approximately 7.1 x 4.5 cm, with surrounding edema. The degree of rightward midline shift measures 4 mm, stable from the prior study. Trace blood within the occipital horns of the lateral ventricles is relatively stable in appearance. Underlying mild cortical volume loss is noted. Cerebellar atrophy is seen. Scattered periventricular white matter change likely reflects small vessel ischemic microangiopathy. The brainstem and fourth ventricle are within normal limits. There is no evidence of fracture; visualized osseous structures are unremarkable in appearance. The orbits are within normal limits. The paranasal sinuses and mastoid air cells are well-aerated. No significant soft tissue abnormalities are seen. IMPRESSION: 1. Large left parietal lobe hematoma is relatively stable in size, measuring approximately 7.1 x 4.5 cm, with surrounding edema. Degree of rightward midline shift is relatively stable, measuring 4 mm. Trace intraventricular blood again noted, stable in appearance. 2. Underlying mild cortical volume loss and scattered small vessel ischemic microangiopathy. Electronically Signed By: Roanna Raider M.D. On: 02/26/2015 04:04   Dg Chest Port 1 View  02/25/2015 CLINICAL DATA: Aspiration into airway EXAM: PORTABLE CHEST 1 VIEW COMPARISON: Yesterday FINDINGS: Laterally directed right neck catheter has unchanged malpositioned appearance, with tip likely in the right subclavian vein. Interval removal of a feeding tube. There is no edema, consolidation, effusion, or pneumothorax. Normal heart size and stable mediastinal contours. IMPRESSION: Clear lungs. No evidence of aspiration pneumonitis. Electronically  Signed By: Marnee Spring M.D. On: 02/25/2015 03:20   Dg Chest Port 1 View  02/24/2015 CLINICAL DATA: Altered mental status, subarachnoid hemorrhage, seizures EXAM: PORTABLE CHEST 1 VIEW COMPARISON: 03/09/2015 FINDINGS: Lungs are clear. No pleural effusion or pneumothorax. The heart is normal in size. Stable right IJ venous catheter terminating in the right subclavian vein. Enteric tube courses into the stomach. IMPRESSION: No evidence of acute cardiopulmonary disease. Stable right IJ venous catheter terminating in the right subclavian vein. Enteric tube courses into the stomach. Electronically Signed By: Charline Bills M.D. On: 02/24/2015 11:31   Dg Abd Portable 1v  02/25/2015 CLINICAL DATA: Feeding tube placement. EXAM: PORTABLE ABDOMEN - 1 VIEW COMPARISON: 02/23/2015 FINDINGS: Weighted tip enteric tube has mildly advanced in the interim and projects over the proximal third portion of the duodenum. Gas is present in nondilated loops of small and large bowel throughout the abdomen without evidence of obstruction. The visualized lung bases are grossly clear. No acute osseous abnormality is identified. IMPRESSION: Interval advancement of feeding  tube into the third portion of the duodenum. Electronically Signed By: Sebastian AcheAllen Grady M.D. On: 02/25/2015 11:25        2 D echocardiogram 02/23/2015 Study Conclusions - Left ventricle: The cavity size was normal. There was mild focal basal hypertrophy of the septum. Systolic function was normal. The estimated ejection fraction was in the range of 60% to 65%. Wall motion was normal; there were no regional wall motion abnormalities. Impressions: - Moderate thickening of noncoronary cusp of aortic valve. Cannot rule out vegetation. Consider TEE if clinically indicated. No cardiac source of embolism was identified, but cannot be ruled out on the basis of this examination.    HISTORY OF PRESENT ILLNESS 64 y/o M w/ PMHx  of HTN, DM type II, h/o seizures and subarachnoid hemorrhage in 06/2013, brought to ED by EMS for right hemiplegia, aphasia, and left gaze preference. Per chart review, patient was at home, took a nap yesterday morning around 10 AM. At 1 PM, wife could not wake him from sleep at which time EMS was called. BP documented by EMS was 180/120. No report of recent head trauma, use of anticoagulants, or antiplatelets.He remained with SBP of 190/110 in the ED, was given 10 mg IV labetalol and started IV nicardipine.CT head in ED showed a large left frontoparietal intracerebral hemorrhage measuring up to 6 cm. 4 mm of left-to-right midline shift. No hydrocephalus or IV extension. Seen by neurosurgery, patient not considered a surgical candidate at the time.  He was admitted to the neuro ICU for further evaluation and treatment.  HOSPITAL COURSE 64 y/o M w/ PMHx of HTN, DM type II, h/o seizures and subarachnoid hemorrhage in 06/2013, [facility] for a large left frontal parietal hemorrhage. Neurosurgery was consultedt; however given the location of the hemorrhage it was felt that surgical intervention was not indicated. The patient was admitted to the neuro intensive care unit for further monitoring and treatment.  Stroke Care Pathway Revealed: Left Frontoparietal Parenchymal Hematoma with Subarachnoid Extension:   Resultant right hemiplegia, aphasia, and left gaze preference  CT Head: Left frontoparietal parenchymal hematoma measuring 6.9 x 4.7 cm. Similar localized vasogenic edema with persistent 4 mm of left-to-right shift. New intraventricular extension with bloodlayering in the occipital horns of both lateral ventricles. Dilation of the temporal horns suspicious for developing hydrocephalus. Also significant for acute subarachnoid hemorrhage within the left sylvian fissure  2D Echo EF 60-65%. Moderate thickening of noncoronary cusp of aortic valve. Cannot rule  out vegetation.  HgbA1c 6.8  SCD's only for VTE prophylaxis  Diet NPO   No antithrombotic prior to admission, now on No antithrombotic given ICH  NOT a surgical candidate based on location.   Ongoing aggressive stroke risk factor management  Goal was to hold 3% saline given Na 161. Goal 150-155.  Disposition: On 02/26/2015 the patient developed hypoxia with respiratory distress. Critical care was consulted and the patient was intubated and placed on a ventilator.Following extensive discussions with Dr. Molli KnockYacoub and Dr. Earl LitesGregory the family elected to withdraw care. The patient's status was changed to DO NOT RESUSCITATE and morphine therapy was initiated. In compliance with the family's wishes the patient was extubated on 02/26/2015 and subsequently expired on 11/20/2014 at 0817.   Disposition:  Deceased - Time of death [id number]  Patient seen and examined by me earlier in the day.  Family discussion at bedside regarding Palliative Care/Hospice choices.  30 minutes of care time provided 30 minutes were spent preparing discharge.

## 2015-03-17 NOTE — Progress Notes (Signed)
Patient expired at [id number].  Family was present and extended family notified.  Patient has been transferred to morgue.

## 2015-03-17 DEATH — deceased

## 2016-08-10 IMAGING — CT CT HEAD W/O CM
2 series · 15 of 30 positions shown, 19 images · non-contrast
Comparison: CT of the head performed 02/23/2015

CLINICAL DATA: Acute altered mental status.  Subsequent encounter.

EXAM:
CT HEAD WITHOUT CONTRAST
TECHNIQUE: Contiguous axial images were obtained from the base of the skull
through the vertex without intravenous contrast.

[Series 201: head w/o, idose (1) · axial · non-contrast · 0.49mm/px · z∈[+79,+204]mm · 13 of 31 slices shown, 17 images]
[im 3/31  brain]
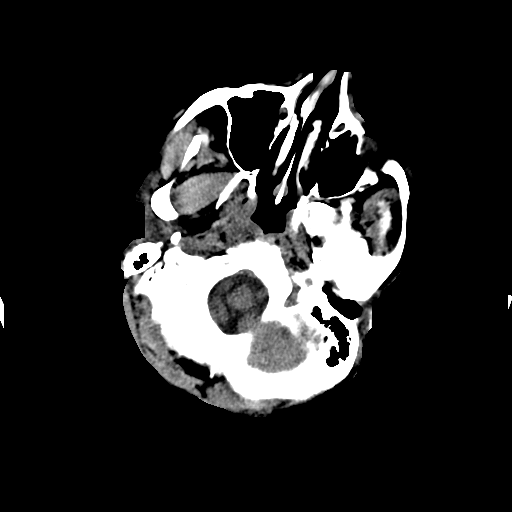
[im 3/31  bone]
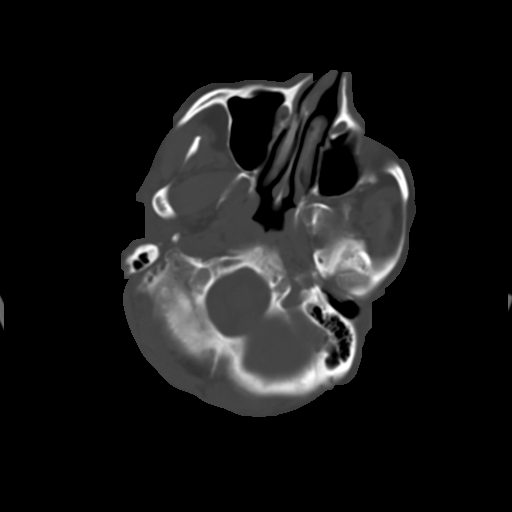
[im 5/31  brain]
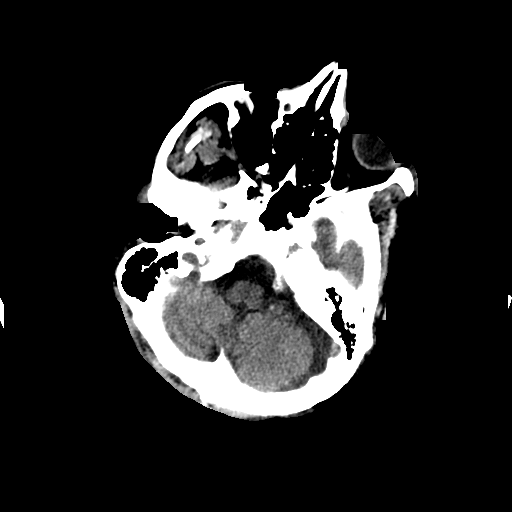
[im 7/31  brain]
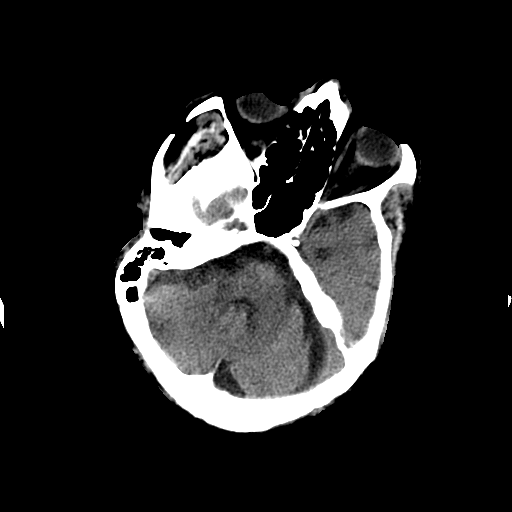
[im 9/31  brain]
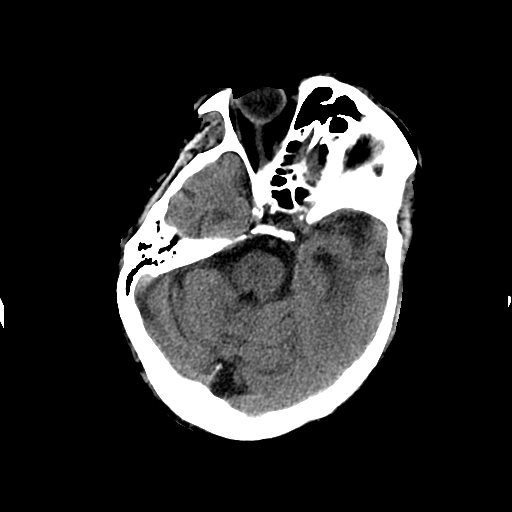
[im 11/31  brain]
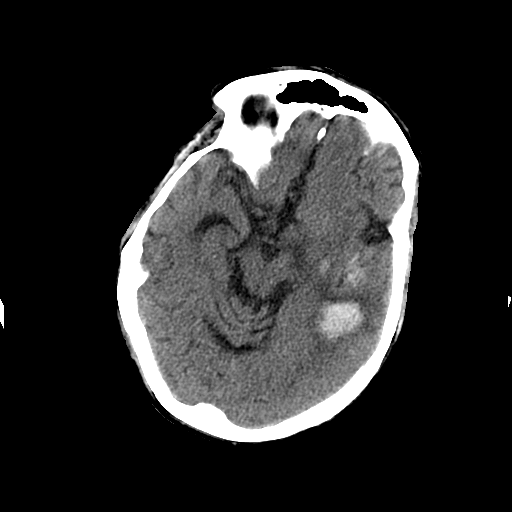
[im 11/31  bone]
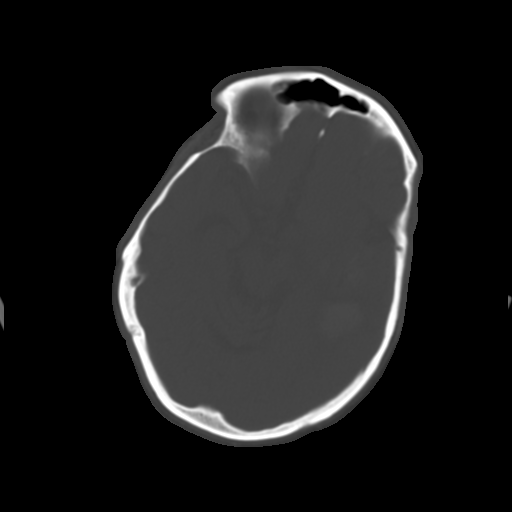
[im 13/31  brain]
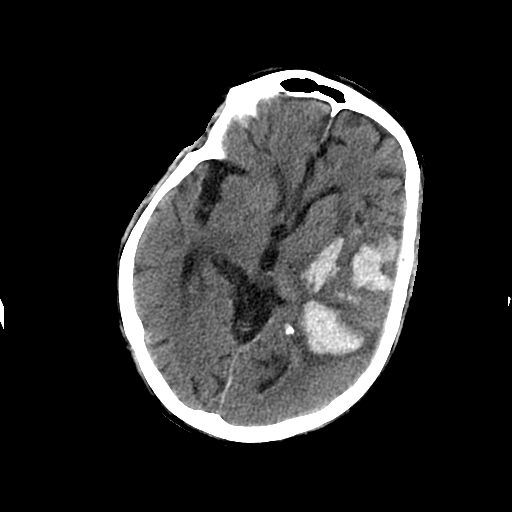
[im 16/31  brain]
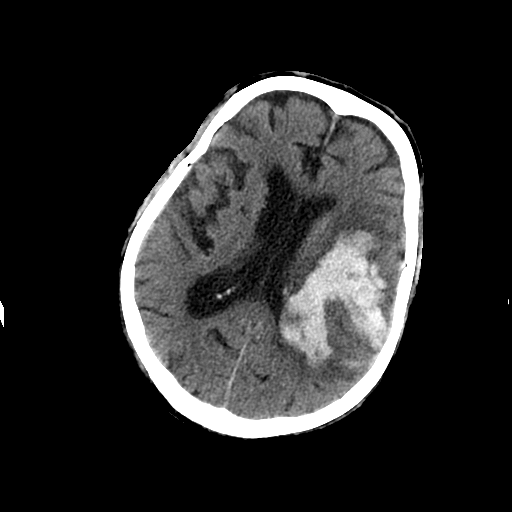
[im 18/31  brain]
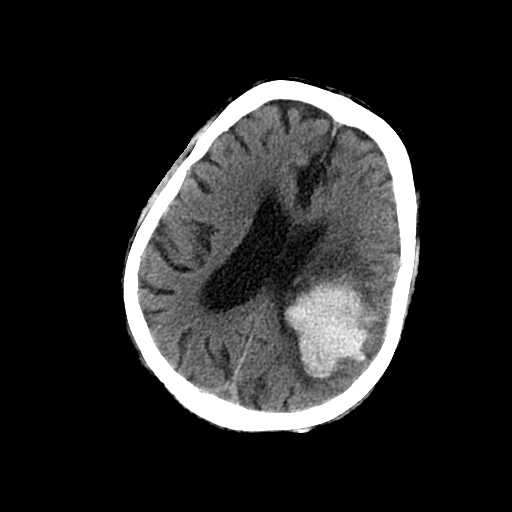
[im 20/31  brain]
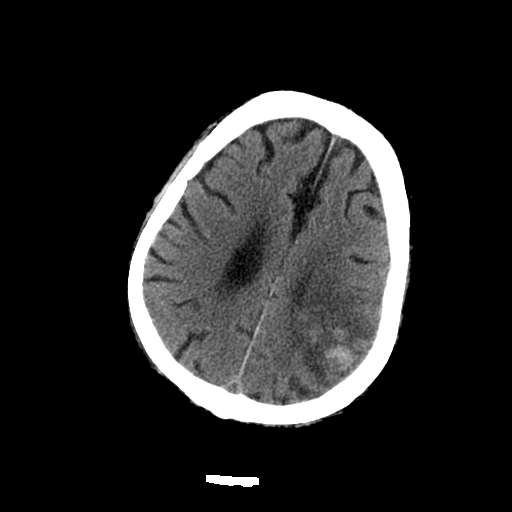
[im 20/31  bone]
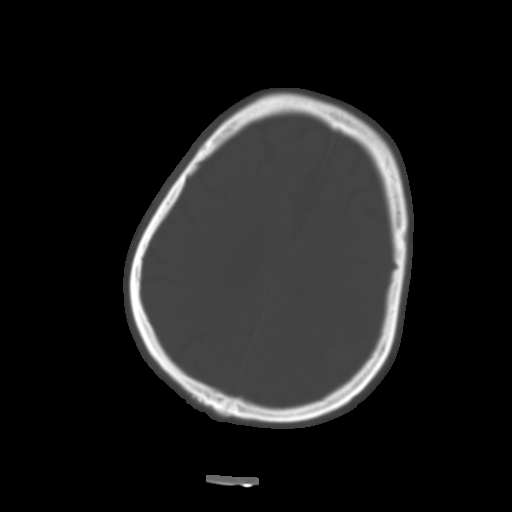
[im 22/31  brain]
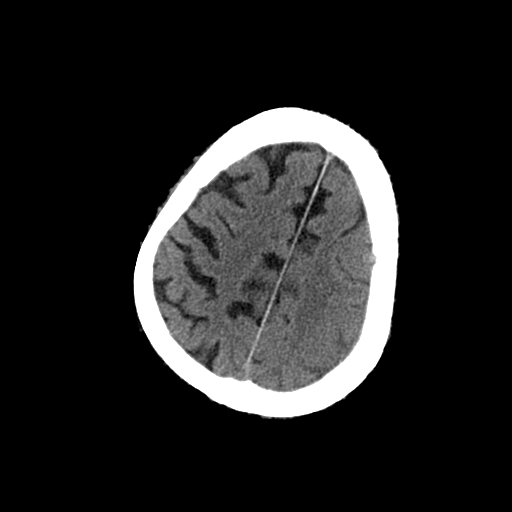
[im 24/31  brain]
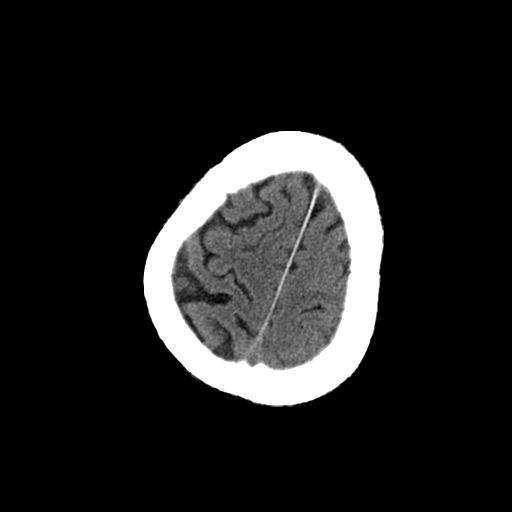
[im 26/31  brain]
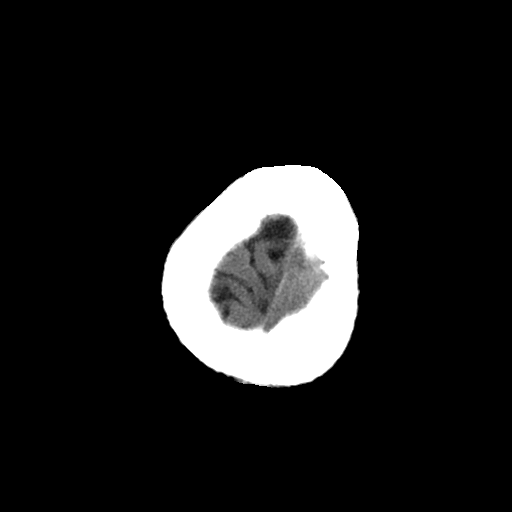
[im 28/31  brain]
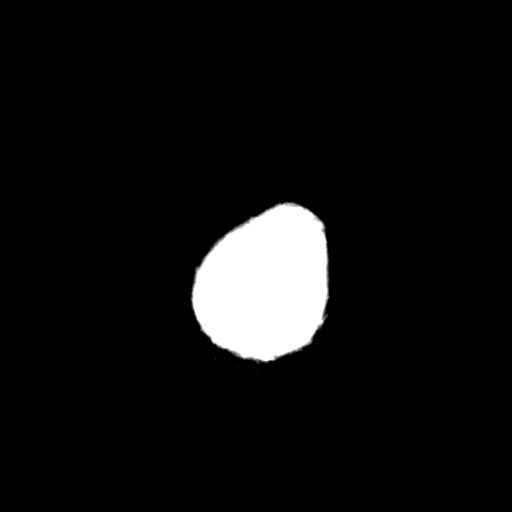
[im 28/31  bone]
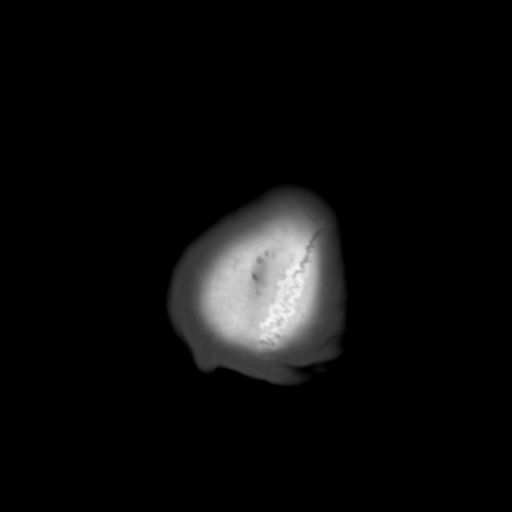

[Series 202: head w/o bone, idose (1) · axial · non-contrast · 0.49mm/px · z∈[+79,+99]mm · 2 of 31 slices shown]
[im 3/31  bone]
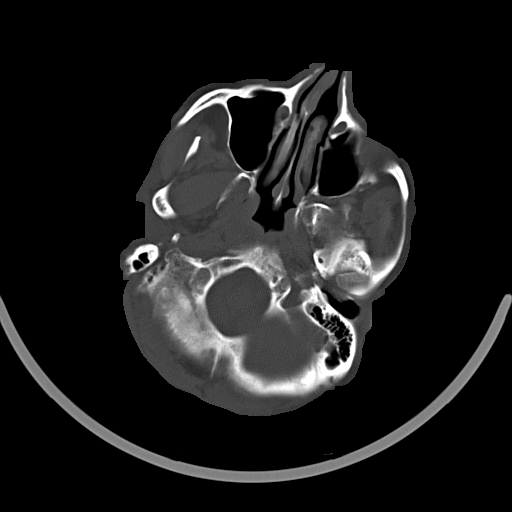
[im 7/31  bone]
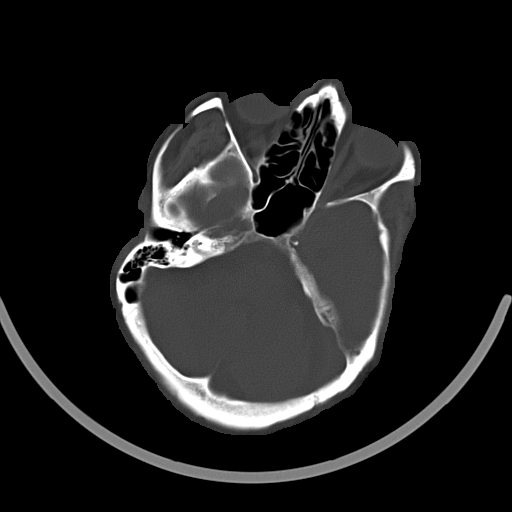

[15 of 30 positions shown; findings below may reference images not displayed]

FINDINGS: The known large left parietal lobe hematoma is relatively stable in
size, measuring approximately 7.1 x 4.5 cm, with surrounding edema.
The degree of rightward midline shift measures 4 mm, stable from the
prior study. Trace blood within the occipital horns of the lateral
ventricles is relatively stable in appearance.

Underlying mild cortical volume loss is noted. Cerebellar atrophy is
seen. Scattered periventricular white matter change likely reflects
small vessel ischemic microangiopathy.

The brainstem and fourth ventricle are within normal limits.

There is no evidence of fracture; visualized osseous structures are
unremarkable in appearance. The orbits are within normal limits. The
paranasal sinuses and mastoid air cells are well-aerated. No
significant soft tissue abnormalities are seen.
IMPRESSION: 1. Large left parietal lobe hematoma is relatively stable in size,
measuring approximately 7.1 x 4.5 cm, with surrounding edema. Degree
of rightward midline shift is relatively stable, measuring 4 mm.
Trace intraventricular blood again noted, stable in appearance.
2. Underlying mild cortical volume loss and scattered small vessel
ischemic microangiopathy.
